# Patient Record
Sex: Male | Born: 1975
Health system: Southern US, Community
[De-identification: ages and names within clinical notes are randomized; demographics above are authoritative.]

---

## 1998-06-09 ENCOUNTER — Emergency Department (HOSPITAL_COMMUNITY): Admission: EM | Admit: 1998-06-09 | Discharge: 1998-06-09 | Payer: Self-pay | Admitting: Emergency Medicine

## 2000-09-05 ENCOUNTER — Emergency Department (HOSPITAL_COMMUNITY): Admission: EM | Admit: 2000-09-05 | Discharge: 2000-09-05 | Payer: Self-pay | Admitting: Emergency Medicine

## 2001-08-28 ENCOUNTER — Emergency Department (HOSPITAL_COMMUNITY): Admission: EM | Admit: 2001-08-28 | Discharge: 2001-08-28 | Payer: Self-pay | Admitting: Emergency Medicine

## 2002-03-09 ENCOUNTER — Emergency Department (HOSPITAL_COMMUNITY): Admission: EM | Admit: 2002-03-09 | Discharge: 2002-03-09 | Payer: Self-pay | Admitting: Emergency Medicine

## 2002-03-09 ENCOUNTER — Encounter: Payer: Self-pay | Admitting: *Deleted

## 2004-08-23 ENCOUNTER — Emergency Department (HOSPITAL_COMMUNITY): Admission: EM | Admit: 2004-08-23 | Discharge: 2004-08-23 | Payer: Self-pay | Admitting: Emergency Medicine

## 2006-02-12 ENCOUNTER — Emergency Department (HOSPITAL_COMMUNITY): Admission: EM | Admit: 2006-02-12 | Discharge: 2006-02-12 | Payer: Self-pay | Admitting: Emergency Medicine

## 2008-10-13 ENCOUNTER — Ambulatory Visit: Payer: Self-pay | Admitting: Internal Medicine

## 2008-11-28 ENCOUNTER — Telehealth: Payer: Self-pay | Admitting: Internal Medicine

## 2009-08-02 ENCOUNTER — Emergency Department (HOSPITAL_BASED_OUTPATIENT_CLINIC_OR_DEPARTMENT_OTHER): Admission: EM | Admit: 2009-08-02 | Discharge: 2009-08-02 | Payer: Self-pay | Admitting: Emergency Medicine

## 2010-10-15 ENCOUNTER — Ambulatory Visit: Payer: Self-pay | Admitting: Internal Medicine

## 2010-10-15 DIAGNOSIS — F17200 Nicotine dependence, unspecified, uncomplicated: Secondary | ICD-10-CM

## 2010-10-15 DIAGNOSIS — R5381 Other malaise: Secondary | ICD-10-CM | POA: Insufficient documentation

## 2010-10-15 DIAGNOSIS — R5383 Other fatigue: Secondary | ICD-10-CM

## 2010-10-15 LAB — CONVERTED CEMR LAB
ALT: 25 units/L (ref 0–53)
AST: 22 units/L (ref 0–37)
Albumin: 4.2 g/dL (ref 3.5–5.2)
Alkaline Phosphatase: 96 units/L (ref 39–117)
BUN: 10 mg/dL (ref 6–23)
Basophils Absolute: 0.1 10*3/uL (ref 0.0–0.1)
Basophils Relative: 1.2 % (ref 0.0–3.0)
Bilirubin, Direct: 0.1 mg/dL (ref 0.0–0.3)
CO2: 20 meq/L (ref 19–32)
Calcium: 9.5 mg/dL (ref 8.4–10.5)
Chloride: 105 meq/L (ref 96–112)
Cholesterol: 187 mg/dL (ref 0–200)
Creatinine, Ser: 0.9 mg/dL (ref 0.4–1.5)
Eosinophils Absolute: 0.2 10*3/uL (ref 0.0–0.7)
Eosinophils Relative: 3.1 % (ref 0.0–5.0)
GFR calc non Af Amer: 118.63 mL/min (ref >60)
Glucose, Bld: 93 mg/dL (ref 70–99)
HCT: 47.5 % (ref 39.0–52.0)
HDL: 46.8 mg/dL (ref >39.00)
Hemoglobin: 16.7 g/dL (ref 13.0–17.0)
LDL Cholesterol: 121 mg/dL — ABNORMAL HIGH (ref 0–99)
Lymphocytes Relative: 38.1 % (ref 12.0–46.0)
Lymphs Abs: 2.9 10*3/uL (ref 0.7–4.0)
MCHC: 35.1 g/dL (ref 30.0–36.0)
MCV: 91.9 fL (ref 78.0–100.0)
Monocytes Absolute: 0.5 10*3/uL (ref 0.1–1.0)
Monocytes Relative: 6.9 % (ref 3.0–12.0)
Neutro Abs: 3.8 10*3/uL (ref 1.4–7.7)
Neutrophils Relative %: 50.7 % (ref 43.0–77.0)
Platelets: 175 10*3/uL (ref 150.0–400.0)
Potassium: 4.6 meq/L (ref 3.5–5.1)
RBC: 5.17 M/uL (ref 4.22–5.81)
RDW: 13.8 % (ref 11.5–14.6)
Sodium: 140 meq/L (ref 135–145)
TSH: 1.42 microintl units/mL (ref 0.35–5.50)
Testosterone: 556.1 ng/dL (ref 350.00–890.00)
Total Bilirubin: 0.6 mg/dL (ref 0.3–1.2)
Total CHOL/HDL Ratio: 4
Total Protein: 7.3 g/dL (ref 6.0–8.3)
Triglycerides: 96 mg/dL (ref 0.0–149.0)
VLDL: 19.2 mg/dL (ref 0.0–40.0)
WBC: 7.6 10*3/uL (ref 4.5–10.5)

## 2010-10-16 ENCOUNTER — Encounter: Payer: Self-pay | Admitting: Internal Medicine

## 2010-10-22 ENCOUNTER — Telehealth: Payer: Self-pay | Admitting: Internal Medicine

## 2010-10-25 ENCOUNTER — Telehealth: Payer: Self-pay | Admitting: Internal Medicine

## 2011-01-24 NOTE — Progress Notes (Signed)
  Phone Note Call from Patient   Caller: Patient Summary of Call: Pt called to find out what his lab results were. Read letter.  Initial call taken by: Alysia Penna,  October 25, 2010 12:55 PM

## 2011-01-24 NOTE — Letter (Signed)
Summary: Lipid Letter  [facility]  [address] [place], [place] [address]   Phone: [phone]  Fax: [phone]    10/16/2010  Adam Morton [address] [place], [place]  [address]  Dear Adam Morton:  We have carefully reviewed your last lipid profile from 10/15/2010 and the results are noted below with a summary of recommendations for lipid management.    Cholesterol:       187     Goal: <200   HDL "good" Cholesterol:   96.29     Goal: >40   LDL "bad" Cholesterol:   121     Goal: <130   Triglycerides:       96.0     Goal: <150        TLC Diet (Therapeutic Lifestyle Change): Saturated Fats & Transfatty acids should be kept < 7% of total calories ***Reduce Saturated Fats Polyunstaurated Fat can be up to 10% of total calories Monounsaturated Fat Fat can be up to 20% of total calories Total Fat should be no greater than 25-35% of total calories Carbohydrates should be 50-60% of total calories Protein should be approximately 15% of total calories Fiber should be at least 20-30 grams a day ***Increased fiber may help lower LDL Total Cholesterol should be < 200mg /day Consider adding plant stanol/sterols to diet (example: Benacol spread) ***A higher intake of unsaturated fat may reduce Triglycerides and Increase HDL    Adjunctive Measures (may lower LIPIDS and reduce risk of Heart Attack) include: Aerobic Exercise (20-30 minutes 3-4 times a week) Limit Alcohol Consumption Weight Reduction Aspirin 75-81 mg a day by mouth (if not allergic or contraindicated) Dietary Fiber 20-30 grams a day by mouth     Current Medications: 1)    Chantix Starting Month Pak 0.5 Mg X 11 & 1 Mg X 42 Tabs (Varenicline tartrate) .... Take as directed  If you have any questions, please call. We appreciate being able to work with you.   Sincerely,    [facility] Etta Grandchild MD

## 2011-01-24 NOTE — Progress Notes (Signed)
  Phone Note Call from Patient   Call For: [phone] or [phone] Summary of Call: mailed results to pt today. Initial call taken by: Jarome Lamas,  October 22, 2010 11:02 AM

## 2011-01-24 NOTE — Letter (Signed)
Summary: Results Follow-up Letter  [facility]  [address] [place], [place] [address]   Phone: [phone]  Fax: [phone]    10/16/2010  [address] [place], [place]  [address]  Dear Adam Morton,   The following are the results of your recent test(s):  Test     Result     Testosterone   normal Liver/kidney   normal CBC       normal   _________________________________________________________  Please call for an appointment as directed _________________________________________________________ _________________________________________________________ _________________________________________________________  Sincerely,  Sanda Linger MD [facility]

## 2011-01-24 NOTE — Assessment & Plan Note (Signed)
Summary: PHYSICAL---STC   Vital Signs:  Patient profile:   35 year old male Height:      72 inches Weight:      162 pounds BMI:     22.05 O2 Sat:      98 % on Room air Temp:     98.3 degrees F oral Pulse rate:   72 / minute Pulse rhythm:   regular Resp:     16 per minute BP sitting:   114 / 80  (left arm)  O2 Flow:  Room air  Primary Care Provider:  Etta Grandchild MD   History of Present Illness: He returns for a complete physical but he also has complaints and requests.  1. He wants to try chantix to quit smoking 2. He has had fatigue for one year and wants to know if his testosterone level is low.  Preventive Screening-Counseling & Management  Alcohol-Tobacco     Alcohol drinks/day: 3     Alcohol type: vodka     >5/day in last 3 mos: no     Alcohol Counseling: not indicated; use of alcohol is not excessive or problematic     Feels need to cut down: no     Feels annoyed by complaints: no     Feels guilty re: drinking: no     Needs 'eye opener' in am: no     Smoking Status: current     Smoking Cessation Counseling: yes     Smoke Cessation Stage: ready     Packs/Day: 1.5     Year Started: 1993     Cans of tobacco/week: no     Tobacco Counseling: to quit use of tobacco products  Hep-HIV-STD-Contraception     Hepatitis Risk: no risk noted     HIV Risk: no risk noted     STD Risk: no risk noted     Dental Visit-last 6 months yes     Dental Care Counseling: to seek dental care; no dental care within six months     TSE monthly: yes     Testicular SE Education/Counseling to perform regular STE  Safety-Violence-Falls     Seat Belt Use: yes     Helmet Use: n/a     Firearms in the Home: no firearms in the home     Smoke Detectors: no     Violence in the Home: no risk noted      Sexual History:  currently monogamous.        Drug Use:  never.        Blood Transfusions:  no.    Medications Prior to Update: 1)  None  Current Medications (verified): 1)   Chantix Starting Month Pak 0.5 Mg X 11 & 1 Mg X 42 Tabs (Varenicline Tartrate) .... 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  Past Surgical History: Last updated: 10/13/2008 Denies surgical history  Family History: Last updated: 10/13/2008 Mom A and W 32 yoa Father A and W 31 yoa Brother A and W 25 yoa  Social History: Last updated: 10/13/2008 Occupation: Event Planner/promoter Single Current Smoker Alcohol use-yes Drug use-no Regular exercise-yes  Risk Factors: Alcohol Use: 3 (10/15/2010) >5 drinks/d w/in last 3 months: no (10/15/2010) Exercise: yes (10/13/2008)  Risk Factors: Smoking Status: current (10/15/2010) Packs/Day: 1.5 (10/15/2010) Cans of tobacco/wk: no (10/15/2010)  Family History: Reviewed history from 10/13/2008 and no changes required. Mom  A and W 58 yoa Father A and W 95 yoa Brother A and W 25 yoa  Social History: Reviewed history from 10/13/2008 and no changes required. Occupation: Event Planner/promoter Single Current Smoker Alcohol use-yes Drug use-no Regular exercise-yes Hepatitis Risk:  no risk noted HIV Risk:  no risk noted STD Risk:  no risk noted Dental Care w/in 6 mos.:  yes Seat Belt Use:  yes Sexual History:  currently monogamous Drug Use:  never Blood Transfusions:  no  Review of Systems  The patient denies anorexia, fever, weight loss, weight gain, chest pain, syncope, dyspnea on exertion, peripheral edema, prolonged cough, headaches, hemoptysis, abdominal pain, hematuria, suspicious skin lesions, difficulty walking, depression, enlarged lymph nodes, angioedema, and testicular masses.   General:  Complains of fatigue; denies chills, fever, loss of appetite, malaise, sleep disorder, sweats, weakness, and weight loss. GU:  Denies decreased libido, discharge, erectile dysfunction, hematuria, nocturia, urinary  frequency, and urinary hesitancy.  Physical Exam  General:  alert, well-developed, well-nourished, well-hydrated, appropriate dress, normal appearance, and healthy-appearing.   Eyes:  No corneal or conjunctival inflammation noted. EOMI. Perrla. Funduscopic exam benign, without hemorrhages, exudates or papilledema. Vision grossly normal. Mouth:  Oral mucosa and oropharynx without lesions or exudates.  Teeth in good repair. Neck:  supple, full ROM, no masses, no thyromegaly, no thyroid nodules or tenderness, and no JVD.   Breasts:  No masses or gynecomastia noted Lungs:  Normal respiratory effort, chest expands symmetrically. Lungs are clear to auscultation, no crackles or wheezes. Heart:  Normal rate and regular rhythm. S1 and S2 normal without gallop, murmur, click, rub or other extra sounds. Abdomen:  soft, non-tender, normal bowel sounds, no distention, no hepatomegaly, and no splenomegaly.   Genitalia:  circumcised, no hydrocele, no varicocele, no scrotal masses, no testicular masses or atrophy, no cutaneous lesions, and no urethral discharge.   Msk:  No deformity or scoliosis noted of thoracic or lumbar spine.   Pulses:  R and L carotid,radial,femoral,dorsalis pedis and posterior tibial pulses are full and equal bilaterally Extremities:  No clubbing, cyanosis, edema, or deformity noted with normal full range of motion of all joints.   Neurologic:  No cranial nerve deficits noted. Station and gait are normal. Plantar reflexes are down-going bilaterally. DTRs are symmetrical throughout. Sensory, motor and coordinative functions appear intact. Skin:  turgor normal, color normal, no rashes, no suspicious lesions, no ecchymoses, no petechiae, no purpura, no ulcerations, no edema, and tattoo(s).   Cervical Nodes:  no anterior cervical adenopathy and no posterior cervical adenopathy.   Axillary Nodes:  no R axillary adenopathy and no L axillary adenopathy.   Inguinal Nodes:  no R inguinal adenopathy  and no L inguinal adenopathy.   Psych:  Cognition and judgment appear intact. Alert and cooperative with normal attention span and concentration. No apparent delusions, illusions, hallucinations   Impression & Recommendations:  Problem # 1:  TOBACCO USE (ICD-305.1) Assessment Unchanged  His updated medication list for this problem includes:    Chantix Starting Month Pak 0.5 Mg X 11 & 1 Mg X 42 Tabs (Varenicline tartrate) .Marland Kitchen... Take as directed  Orders: Venipuncture (16109) TLB-Lipid Panel (80061-LIPID) TLB-BMP (Basic Metabolic Panel-BMET) (80048-METABOL) TLB-Hepatic/Liver Function Pnl (80076-HEPATIC) TLB-TSH (Thyroid Stimulating Hormone) (84443-TSH) TLB-CBC Platelet - w/Differential (85025-CBCD) TLB-Testosterone, Total (84403-TESTO) Tobacco use cessation intermediate 3-10 minutes ([address])  Problem # 2:  FATIGUE (ICD-780.79) Assessment: New  Orders: Venipuncture (09811) TLB-Lipid Panel (80061-LIPID) TLB-BMP (Basic Metabolic Panel-BMET) (80048-METABOL) TLB-Hepatic/Liver Function Pnl (80076-HEPATIC) TLB-TSH (Thyroid Stimulating Hormone) (84443-TSH) TLB-CBC  Platelet - w/Differential (85025-CBCD) TLB-Testosterone, Total (84403-TESTO) Tobacco use cessation intermediate 3-10 minutes (09323)  Problem # 3:  HEALTH MAINTENANCE EXAM (ICD-V70.0) Assessment: Unchanged  Orders: Venipuncture (55732) TLB-Lipid Panel (80061-LIPID) TLB-BMP (Basic Metabolic Panel-BMET) (80048-METABOL) TLB-Hepatic/Liver Function Pnl (80076-HEPATIC) TLB-TSH (Thyroid Stimulating Hormone) (84443-TSH) TLB-CBC Platelet - w/Differential (85025-CBCD) TLB-Testosterone, Total (84403-TESTO)  Discussed using sunscreen, use of alcohol, drug use, self testicular exam, routine dental care, routine eye care, routine physical exam, seat belts, multiple vitamins, osteoporosis prevention, adequate calcium intake in diet, and recommendations for immunizations.  Discussed exercise and checking cholesterol.  Discussed gun  safety, safe sex, and contraception. Also recommend checking PSA.  Complete Medication List: 1)  Chantix Starting Month Pak 0.5 Mg X 11 & 1 Mg X 42 Tabs (Varenicline tartrate) .... Take as directed  Other Orders: Tdap => 38yrs IM (20254) Admin 1st Vaccine (27062)  Patient Instructions: 1)  Please schedule a follow-up appointment in 1 month. 2)  Tobacco is very bad for your health and your loved ones! You Should stop smoking!. 3)  Stop Smoking Tips: Choose a Quit date. Cut down before the Quit date. decide what you will do as a substitute when you feel the urge to smoke(gum,toothpick,exercise). 4)  If you could be exposed to sexually transmitted diseases, you should use a condom. Prescriptions: CHANTIX STARTING MONTH PAK 0.5 MG X 11 & 1 MG X 42 TABS (VARENICLINE TARTRATE) take as directed  #1 month x 0   Entered and Authorized by:   Etta Grandchild MD   Signed by:   Etta Grandchild MD on 10/15/2010   Method used:   Electronically to        [facility] [address] (retail)       [phone]       [place], [place]  [address]       Ph: [phone]       Fax: [phone]   RxID:   [id number]    Orders Added: 1)  Venipuncture [16967] 2)  TLB-Lipid Panel [80061-LIPID] 3)  TLB-BMP (Basic Metabolic Panel-BMET) [80048-METABOL] 4)  TLB-Hepatic/Liver Function Pnl [80076-HEPATIC] 5)  TLB-TSH (Thyroid Stimulating Hormone) [84443-TSH] 6)  TLB-CBC Platelet - w/Differential [85025-CBCD] 7)  TLB-Testosterone, Total [84403-TESTO] 8)  Tdap => 63yrs IM [90715] 9)  Admin 1st Vaccine [90471] 10)  Est. Patient 18-39 years [99395] 11)  Tobacco use cessation intermediate 3-10 minutes [99406] 12)  Est. Patient Level IV [89381]   Immunizations Administered:  Tetanus Vaccine:    Vaccine Type: Tdap    Site: right deltoid    Mfr: GlaxoSmithKline    Dose: 0.5 ml    Route: IM    Given by: Rock Nephew CMA    Exp. Date: 10/11/2012    Lot #: [id number]     VIS given: 11/09/08 version given October 15, 2010.   Immunizations Administered:  Tetanus Vaccine:    Vaccine Type: Tdap    Site: right deltoid    Mfr: GlaxoSmithKline    Dose: 0.5 ml    Route: IM    Given by: Rock Nephew CMA    Exp. Date: 10/11/2012    Lot #: [id number]    VIS given: 11/09/08 version given October 15, 2010.

## 2011-07-26 ENCOUNTER — Emergency Department (HOSPITAL_BASED_OUTPATIENT_CLINIC_OR_DEPARTMENT_OTHER)
Admission: EM | Admit: 2011-07-26 | Discharge: 2011-07-26 | Disposition: A | Discharge status: Home / Self Care | Payer: Self-pay | Attending: Emergency Medicine | Admitting: Emergency Medicine

## 2011-07-26 ENCOUNTER — Encounter: Payer: Self-pay | Admitting: *Deleted

## 2011-07-26 DIAGNOSIS — R21 Rash and other nonspecific skin eruption: Secondary | ICD-10-CM | POA: Insufficient documentation

## 2011-07-26 MED ORDER — PREDNISONE 10 MG PO TABS: 20.0000 mg | ORAL_TABLET | Freq: Every day | ORAL | Status: AC

## 2011-07-26 NOTE — ED Provider Notes (Signed)
History     CSN: [id number] Arrival date & time: 07/26/2011  6:49 AM  Chief Complaint  Patient presents with  . Rash   Patient is a 35 y.o. male presenting with rash. The history is provided by the patient.  Rash  This is a new problem. The current episode started more than 1 week ago. The problem is associated with nothing. There has been no fever. The rash is present on the torso, right arm and left arm. The pain is at a severity of 0/10. The patient is experiencing no pain. The pain has been constant since onset. Associated symptoms include itching. He has tried anti-itch cream for the symptoms. The treatment provided mild relief.    History reviewed. No pertinent past medical history.  History reviewed. No pertinent past surgical history.  History reviewed. No pertinent family history.  History  Substance Use Topics  . Smoking status: Current Everyday Smoker  . Smokeless tobacco: Not on file  . Alcohol Use: 1.2 oz/week    2 Cans of beer per week      Review of Systems  Skin: Positive for itching and rash.  All other systems reviewed and are negative.    Physical Exam  BP 150/95  Pulse 85  Temp(Src) 97.9 F (36.6 C) (Oral)  Resp 18  SpO2 100%  Physical Exam  Constitutional: He is oriented to person, place, and time. He appears well-developed and well-nourished.  HENT:  Head: Normocephalic and atraumatic.  Eyes: Pupils are equal, round, and reactive to light.  Cardiovascular: Normal rate.   Pulmonary/Chest: Effort normal and breath sounds normal.  Abdominal: Soft.  Musculoskeletal: Normal range of motion.  Neurological: He is alert and oriented to person, place, and time. He has normal reflexes.  Skin: Rash noted. Rash is papular and urticarial.       ED Course  Procedures  MDM Patient advised po prednisone and f/u derm if continues- likely exposure to insect or other allergic dermatitis  Hilario Quarry, MD 07/26/11 [phone]

## 2011-07-26 NOTE — ED Notes (Signed)
Pt with rash to bilat arms x one month and began having rash onlower back x 2 weeks

## 2012-06-13 ENCOUNTER — Emergency Department (HOSPITAL_BASED_OUTPATIENT_CLINIC_OR_DEPARTMENT_OTHER): Payer: Self-pay

## 2012-06-13 ENCOUNTER — Inpatient Hospital Stay (HOSPITAL_BASED_OUTPATIENT_CLINIC_OR_DEPARTMENT_OTHER)
Admission: EM | Admit: 2012-06-13 | Discharge: 2012-06-17 | DRG: 494 | Disposition: A | Discharge status: Home / Self Care | Payer: Self-pay | Attending: Orthopedic Surgery | Admitting: Orthopedic Surgery

## 2012-06-13 ENCOUNTER — Encounter (HOSPITAL_BASED_OUTPATIENT_CLINIC_OR_DEPARTMENT_OTHER): Payer: Self-pay | Admitting: *Deleted

## 2012-06-13 DIAGNOSIS — Z7982 Long term (current) use of aspirin: Secondary | ICD-10-CM

## 2012-06-13 DIAGNOSIS — F1010 Alcohol abuse, uncomplicated: Secondary | ICD-10-CM | POA: Diagnosis present

## 2012-06-13 DIAGNOSIS — F17200 Nicotine dependence, unspecified, uncomplicated: Secondary | ICD-10-CM | POA: Diagnosis present

## 2012-06-13 DIAGNOSIS — Z7289 Other problems related to lifestyle: Secondary | ICD-10-CM

## 2012-06-13 DIAGNOSIS — Y9289 Other specified places as the place of occurrence of the external cause: Secondary | ICD-10-CM

## 2012-06-13 DIAGNOSIS — Z79899 Other long term (current) drug therapy: Secondary | ICD-10-CM

## 2012-06-13 DIAGNOSIS — F1210 Cannabis abuse, uncomplicated: Secondary | ICD-10-CM | POA: Diagnosis present

## 2012-06-13 DIAGNOSIS — S82143A Displaced bicondylar fracture of unspecified tibia, initial encounter for closed fracture: Secondary | ICD-10-CM

## 2012-06-13 DIAGNOSIS — I10 Essential (primary) hypertension: Secondary | ICD-10-CM | POA: Diagnosis present

## 2012-06-13 DIAGNOSIS — S82109A Unspecified fracture of upper end of unspecified tibia, initial encounter for closed fracture: Principal | ICD-10-CM | POA: Diagnosis present

## 2012-06-13 LAB — URINE MICROSCOPIC-ADD ON

## 2012-06-13 LAB — CBC
HCT: 44.5 % (ref 39.0–52.0)
Hemoglobin: 15.6 g/dL (ref 13.0–17.0)
MCHC: 35.3 g/dL (ref 30.0–36.0)
RBC: 4.93 MIL/uL (ref 4.22–5.81)
RDW: 13.2 % (ref 11.5–15.5)
RDW: 13.2 % (ref 11.5–15.5)
WBC: 10.4 10*3/uL (ref 4.0–10.5)
WBC: 13.1 10*3/uL — ABNORMAL HIGH (ref 4.0–10.5)

## 2012-06-13 LAB — DIFFERENTIAL
Basophils Absolute: 0.1 10*3/uL (ref 0.0–0.1)
Basophils Relative: 1 % (ref 0–1)
Lymphocytes Relative: 20 % (ref 12–46)
Neutro Abs: 7.3 10*3/uL (ref 1.7–7.7)
Neutrophils Relative %: 70 % (ref 43–77)

## 2012-06-13 LAB — URINALYSIS, ROUTINE W REFLEX MICROSCOPIC
Glucose, UA: NEGATIVE mg/dL
Leukocytes, UA: NEGATIVE
Protein, ur: NEGATIVE mg/dL
Urobilinogen, UA: 0.2 mg/dL (ref 0.0–1.0)

## 2012-06-13 LAB — BASIC METABOLIC PANEL
BUN: 8 mg/dL (ref 6–23)
CO2: 22 mEq/L (ref 19–32)
Chloride: 106 mEq/L (ref 96–112)
Chloride: 102 mEq/L (ref 96–112)
GFR calc Af Amer: >90 mL/min (ref >90)
GFR calc Af Amer: >90 mL/min (ref >90)
Glucose, Bld: 89 mg/dL (ref 70–99)
Potassium: 4.2 mEq/L (ref 3.5–5.1)
Potassium: 4.3 mEq/L (ref 3.5–5.1)
Sodium: 139 mEq/L (ref 135–145)

## 2012-06-13 MED ORDER — LACTATED RINGERS IV SOLN
INTRAVENOUS | Status: DC
  Administered 2012-06-15 (×2): via INTRAVENOUS

## 2012-06-13 MED ORDER — NICOTINE 21 MG/24HR TD PT24
21.0000 mg | MEDICATED_PATCH | Freq: Once | TRANSDERMAL | Status: DC
  Administered 2012-06-13: 21 mg via TRANSDERMAL

## 2012-06-13 MED ORDER — CHLORHEXIDINE GLUCONATE 4 % EX LIQD
60.0000 mL | Freq: Once | CUTANEOUS | Status: DC
  Filled 2012-06-13: qty 60

## 2012-06-13 MED ORDER — METHOCARBAMOL 500 MG PO TABS
500.0000 mg | ORAL_TABLET | Freq: Three times a day (TID) | ORAL | Status: DC | PRN
  Administered 2012-06-13 – 2012-06-14 (×2): 500 mg via ORAL
  Filled 2012-06-13 (×3): qty 1

## 2012-06-13 MED ORDER — ONDANSETRON HCL 4 MG/2ML IJ SOLN: 4.0000 mg | Freq: Three times a day (TID) | INTRAMUSCULAR | Status: AC | PRN

## 2012-06-13 MED ORDER — MORPHINE SULFATE 2 MG/ML IJ SOLN
2.0000 mg | INTRAMUSCULAR | Status: DC | PRN
  Administered 2012-06-15: 2 mg via INTRAVENOUS
  Filled 2012-06-13: qty 1

## 2012-06-13 MED ORDER — OXYCODONE-ACETAMINOPHEN 5-325 MG PO TABS
2.0000 | ORAL_TABLET | Freq: Once | ORAL | Status: AC
  Administered 2012-06-13: 2 via ORAL
  Filled 2012-06-13: qty 2

## 2012-06-13 MED ORDER — HYDROMORPHONE HCL PF 1 MG/ML IJ SOLN: 1.0000 mg | INTRAMUSCULAR | Status: DC | PRN

## 2012-06-13 MED ORDER — NICOTINE 21 MG/24HR TD PT24: 21.0000 mg | MEDICATED_PATCH | Freq: Once | TRANSDERMAL | Status: DC

## 2012-06-13 MED ORDER — OXYCODONE-ACETAMINOPHEN 5-325 MG PO TABS
1.0000 | ORAL_TABLET | ORAL | Status: DC | PRN
  Administered 2012-06-13 (×3): 2 via ORAL
  Administered 2012-06-14: 1 via ORAL
  Administered 2012-06-14: 2 via ORAL
  Filled 2012-06-13 (×5): qty 2

## 2012-06-13 MED ORDER — CEFAZOLIN SODIUM-DEXTROSE 2-3 GM-% IV SOLR
2.0000 g | INTRAVENOUS | Status: AC
  Administered 2012-06-15: 2 g via INTRAVENOUS
  Filled 2012-06-13 (×2): qty 50

## 2012-06-13 MED ORDER — NICOTINE 21 MG/24HR TD PT24
MEDICATED_PATCH | TRANSDERMAL | Status: AC
  Administered 2012-06-13: 21 mg via TRANSDERMAL
  Filled 2012-06-13: qty 1
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
## 2012-06-14 ENCOUNTER — Inpatient Hospital Stay (HOSPITAL_COMMUNITY): Payer: Self-pay

## 2012-06-14 ENCOUNTER — Encounter (HOSPITAL_COMMUNITY): Payer: Self-pay | Admitting: Anesthesiology

## 2012-06-14 LAB — TYPE AND SCREEN

## 2012-06-14 MED ORDER — METHOCARBAMOL 500 MG PO TABS
500.0000 mg | ORAL_TABLET | Freq: Three times a day (TID) | ORAL | Status: DC | PRN
  Administered 2012-06-14 – 2012-06-15 (×2): 1000 mg via ORAL
  Administered 2012-06-16 – 2012-06-17 (×2): 500 mg via ORAL
  Filled 2012-06-14 (×2): qty 2
  Filled 2012-06-14 (×2): qty 1

## 2012-06-14 MED ORDER — ACETAMINOPHEN 10 MG/ML IV SOLN
1000.0000 mg | Freq: Four times a day (QID) | INTRAVENOUS | Status: AC
  Administered 2012-06-14 – 2012-06-15 (×4): 1000 mg via INTRAVENOUS
  Filled 2012-06-14 (×4): qty 100

## 2012-06-14 MED ORDER — OXYCODONE HCL 5 MG PO TABS
5.0000 mg | ORAL_TABLET | ORAL | Status: DC | PRN
  Administered 2012-06-14 – 2012-06-15 (×2): 10 mg via ORAL
  Filled 2012-06-14 (×2): qty 2

## 2012-06-14 MED ORDER — PREGABALIN 50 MG PO CAPS
75.0000 mg | ORAL_CAPSULE | Freq: Two times a day (BID) | ORAL | Status: DC
  Administered 2012-06-14 – 2012-06-17 (×6): 75 mg via ORAL
  Filled 2012-06-14 (×2): qty 1
  Filled 2012-06-14: qty 2
  Filled 2012-06-14: qty 1
  Filled 2012-06-14 (×3): qty 3
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
## 2012-06-15 ENCOUNTER — Encounter (HOSPITAL_COMMUNITY): Payer: Self-pay | Admitting: Anesthesiology

## 2012-06-15 ENCOUNTER — Encounter (HOSPITAL_COMMUNITY)
Admission: EM | Disposition: A | Discharge status: Home / Self Care | Payer: Self-pay | Source: Home / Self Care | Attending: Orthopedic Surgery

## 2012-06-15 ENCOUNTER — Inpatient Hospital Stay (HOSPITAL_COMMUNITY): Payer: Self-pay

## 2012-06-15 ENCOUNTER — Inpatient Hospital Stay (HOSPITAL_COMMUNITY): Payer: Self-pay | Admitting: Anesthesiology

## 2012-06-15 HISTORY — PX: ORIF TIBIA PLATEAU: SHX2132

## 2012-06-15 SURGERY — OPEN REDUCTION INTERNAL FIXATION (ORIF) TIBIAL PLATEAU
Anesthesia: General | Site: Leg Lower | Laterality: Right | Wound class: Clean

## 2012-06-15 MED ORDER — LABETALOL HCL 5 MG/ML IV SOLN
INTRAVENOUS | Status: DC | PRN
  Administered 2012-06-15 (×4): 5 mg via INTRAVENOUS

## 2012-06-15 MED ORDER — LIDOCAINE HCL (CARDIAC) 20 MG/ML IV SOLN
INTRAVENOUS | Status: DC | PRN
  Administered 2012-06-15: 100 mg via INTRAVENOUS

## 2012-06-15 MED ORDER — LIDOCAINE HCL 4 % MT SOLN
OROMUCOSAL | Status: DC | PRN
  Administered 2012-06-15: 4 mL via TOPICAL

## 2012-06-15 MED ORDER — HYDROMORPHONE HCL PF 1 MG/ML IJ SOLN
INTRAMUSCULAR | Status: AC
  Filled 2012-06-15: qty 1

## 2012-06-15 MED ORDER — HYDRALAZINE HCL 20 MG/ML IJ SOLN
10.0000 mg | Freq: Once | INTRAMUSCULAR | Status: AC
  Administered 2012-06-15: 10 mg via INTRAVENOUS
  Filled 2012-06-15 (×2): qty 0.5

## 2012-06-15 MED ORDER — ONDANSETRON HCL 4 MG/2ML IJ SOLN
INTRAMUSCULAR | Status: DC | PRN
  Administered 2012-06-15: 4 mg via INTRAVENOUS

## 2012-06-15 MED ORDER — ONDANSETRON HCL 4 MG/2ML IJ SOLN: 4.0000 mg | Freq: Four times a day (QID) | INTRAMUSCULAR | Status: DC | PRN

## 2012-06-15 MED ORDER — DOCUSATE SODIUM 100 MG PO CAPS
100.0000 mg | ORAL_CAPSULE | Freq: Two times a day (BID) | ORAL | Status: DC
  Administered 2012-06-16 – 2012-06-17 (×3): 100 mg via ORAL
  Filled 2012-06-15 (×5): qty 1

## 2012-06-15 MED ORDER — ACETAMINOPHEN 10 MG/ML IV SOLN
INTRAVENOUS | Status: AC
  Filled 2012-06-15: qty 100

## 2012-06-15 MED ORDER — ONDANSETRON HCL 4 MG PO TABS: 4.0000 mg | ORAL_TABLET | Freq: Four times a day (QID) | ORAL | Status: DC | PRN

## 2012-06-15 MED ORDER — HYDRALAZINE HCL 20 MG/ML IJ SOLN
INTRAMUSCULAR | Status: DC | PRN
  Administered 2012-06-15: 5 mg via INTRAVENOUS

## 2012-06-15 MED ORDER — MORPHINE SULFATE 10 MG/ML IJ SOLN
INTRAMUSCULAR | Status: DC | PRN
  Administered 2012-06-15: 3 mg via INTRAVENOUS
  Administered 2012-06-15: 4 mg via INTRAVENOUS
  Administered 2012-06-15: 3 mg via INTRAVENOUS

## 2012-06-15 MED ORDER — METOCLOPRAMIDE HCL 10 MG PO TABS: 5.0000 mg | ORAL_TABLET | Freq: Three times a day (TID) | ORAL | Status: DC | PRN

## 2012-06-15 MED ORDER — POLYETHYLENE GLYCOL 3350 17 G PO PACK
17.0000 g | PACK | Freq: Every day | ORAL | Status: DC
  Filled 2012-06-15 (×3): qty 1

## 2012-06-15 MED ORDER — SUFENTANIL CITRATE 50 MCG/ML IV SOLN
INTRAVENOUS | Status: DC | PRN
  Administered 2012-06-15: 10 ug via INTRAVENOUS
  Administered 2012-06-15: 20 ug via INTRAVENOUS
  Administered 2012-06-15 (×2): 10 ug via INTRAVENOUS

## 2012-06-15 MED ORDER — NALOXONE HCL 0.4 MG/ML IJ SOLN: 0.4000 mg | INTRAMUSCULAR | Status: DC | PRN

## 2012-06-15 MED ORDER — TEMAZEPAM 15 MG PO CAPS: 15.0000 mg | ORAL_CAPSULE | Freq: Every evening | ORAL | Status: DC | PRN

## 2012-06-15 MED ORDER — HYDROMORPHONE HCL PF 1 MG/ML IJ SOLN
0.2500 mg | INTRAMUSCULAR | Status: DC | PRN
  Administered 2012-06-15 (×2): 0.5 mg via INTRAVENOUS

## 2012-06-15 MED ORDER — ACETAMINOPHEN 10 MG/ML IV SOLN
1000.0000 mg | Freq: Four times a day (QID) | INTRAVENOUS | Status: DC
  Administered 2012-06-16: 1000 mg via INTRAVENOUS
  Filled 2012-06-15 (×4): qty 100

## 2012-06-15 MED ORDER — DEXAMETHASONE SODIUM PHOSPHATE 4 MG/ML IJ SOLN
INTRAMUSCULAR | Status: DC | PRN
  Administered 2012-06-15: 4 mg via INTRAVENOUS

## 2012-06-15 MED ORDER — POTASSIUM CHLORIDE IN NACL 20-0.9 MEQ/L-% IV SOLN
INTRAVENOUS | Status: DC
  Administered 2012-06-16: 06:00:00 via INTRAVENOUS
  Filled 2012-06-15 (×5): qty 1000

## 2012-06-15 MED ORDER — ROCURONIUM BROMIDE 100 MG/10ML IV SOLN
INTRAVENOUS | Status: DC | PRN
  Administered 2012-06-15: 50 mg via INTRAVENOUS

## 2012-06-15 MED ORDER — CEFAZOLIN SODIUM 1-5 GM-% IV SOLN
1.0000 g | Freq: Three times a day (TID) | INTRAVENOUS | Status: AC
  Administered 2012-06-15 – 2012-06-16 (×3): 1 g via INTRAVENOUS
  Filled 2012-06-15 (×3): qty 50

## 2012-06-15 MED ORDER — SODIUM CHLORIDE 0.9 % IJ SOLN: 9.0000 mL | INTRAMUSCULAR | Status: DC | PRN

## 2012-06-15 MED ORDER — DIPHENHYDRAMINE HCL 50 MG/ML IJ SOLN: 12.5000 mg | Freq: Four times a day (QID) | INTRAMUSCULAR | Status: DC | PRN

## 2012-06-15 MED ORDER — MIDAZOLAM HCL 5 MG/5ML IJ SOLN
INTRAMUSCULAR | Status: DC | PRN
  Administered 2012-06-15: 2 mg via INTRAVENOUS

## 2012-06-15 MED ORDER — DIPHENHYDRAMINE HCL 12.5 MG/5ML PO ELIX: 12.5000 mg | ORAL_SOLUTION | Freq: Four times a day (QID) | ORAL | Status: DC | PRN

## 2012-06-15 MED ORDER — HYDRALAZINE HCL 20 MG/ML IJ SOLN
10.0000 mg | INTRAMUSCULAR | Status: DC | PRN
  Filled 2012-06-15: qty 0.5

## 2012-06-15 MED ORDER — DROPERIDOL 2.5 MG/ML IJ SOLN
INTRAMUSCULAR | Status: DC | PRN
  Administered 2012-06-15: 0.625 mg via INTRAVENOUS

## 2012-06-15 MED ORDER — HYDROMORPHONE 0.3 MG/ML IV SOLN
INTRAVENOUS | Status: DC
  Administered 2012-06-15: 17:00:00 via INTRAVENOUS
  Administered 2012-06-15: 0.9 mg via INTRAVENOUS
  Administered 2012-06-16: 1.2 mg via INTRAVENOUS

## 2012-06-15 MED ORDER — NEOSTIGMINE METHYLSULFATE 1 MG/ML IJ SOLN
INTRAMUSCULAR | Status: DC | PRN
  Administered 2012-06-15: 3 mg via INTRAVENOUS

## 2012-06-15 MED ORDER — HYDROMORPHONE 0.3 MG/ML IV SOLN
INTRAVENOUS | Status: AC
  Filled 2012-06-15: qty 25

## 2012-06-15 MED ORDER — LACTATED RINGERS IV SOLN
INTRAVENOUS | Status: DC | PRN
  Administered 2012-06-15 (×2): via INTRAVENOUS

## 2012-06-15 MED ORDER — METOCLOPRAMIDE HCL 5 MG/ML IJ SOLN: 5.0000 mg | Freq: Three times a day (TID) | INTRAMUSCULAR | Status: DC | PRN

## 2012-06-15 MED ORDER — ONDANSETRON HCL 4 MG/2ML IJ SOLN: 4.0000 mg | Freq: Once | INTRAMUSCULAR | Status: AC | PRN

## 2012-06-15 MED ORDER — PROPOFOL 10 MG/ML IV EMUL
INTRAVENOUS | Status: DC | PRN
  Administered 2012-06-15: 200 mg via INTRAVENOUS

## 2012-06-15 MED ORDER — BISACODYL 10 MG RE SUPP: 10.0000 mg | Freq: Every day | RECTAL | Status: DC | PRN

## 2012-06-15 MED ORDER — GLYCOPYRROLATE 0.2 MG/ML IJ SOLN
INTRAMUSCULAR | Status: DC | PRN
  Administered 2012-06-15: .4 mg via INTRAVENOUS

## 2012-06-15 MED ORDER — METOPROLOL TARTRATE 1 MG/ML IV SOLN
INTRAVENOUS | Status: DC | PRN
  Administered 2012-06-15: 1 mg via INTRAVENOUS
  Administered 2012-06-15 (×2): 2 mg via INTRAVENOUS

## 2012-06-15 MED ORDER — 0.9 % SODIUM CHLORIDE (POUR BTL) OPTIME
TOPICAL | Status: DC | PRN
  Administered 2012-06-15: 1000 mL

## 2012-06-15 MED ORDER — ENOXAPARIN SODIUM 40 MG/0.4ML ~~LOC~~ SOLN
40.0000 mg | SUBCUTANEOUS | Status: DC
  Administered 2012-06-16 – 2012-06-17 (×2): 40 mg via SUBCUTANEOUS
  Filled 2012-06-15 (×4): qty 0.4

## 2012-06-15 SURGICAL SUPPLY — 87 items
BANDAGE ELASTIC 4 VELCRO ST LF (GAUZE/BANDAGES/DRESSINGS) IMPLANT
BANDAGE ELASTIC 6 VELCRO ST LF (GAUZE/BANDAGES/DRESSINGS) IMPLANT
BANDAGE GAUZE ELAST BULKY 4 IN (GAUZE/BANDAGES/DRESSINGS) IMPLANT
BIT DRILL 100X2.5XANTM LCK (BIT) ×1 IMPLANT
BIT DRILL CAL (BIT) ×1 IMPLANT
BIT DRL 100X2.5XANTM LCK (BIT) ×1
BLADE SURG 10 STRL SS (BLADE) ×2 IMPLANT
BLADE SURG 15 STRL LF DISP TIS (BLADE) ×1 IMPLANT
BLADE SURG 15 STRL SS (BLADE) ×1
BLADE SURG ROTATE 9660 (MISCELLANEOUS) ×2 IMPLANT
BRUSH SCRUB DISP (MISCELLANEOUS) ×4 IMPLANT
CEMENT CAL PHOSPHATE 5CC (Cement) ×2 IMPLANT
CLEANER TIP ELECTROSURG 2X2 (MISCELLANEOUS) ×2 IMPLANT
CLOTH BEACON ORANGE TIMEOUT ST (SAFETY) ×2 IMPLANT
COVER MAYO STAND STRL (DRAPES) ×2 IMPLANT
DRAPE C-ARM 42X72 X-RAY (DRAPES) ×2 IMPLANT
DRAPE C-ARMOR (DRAPES) ×2 IMPLANT
DRAPE INCISE IOBAN 66X45 STRL (DRAPES) ×2 IMPLANT
DRAPE ORTHO SPLIT 77X108 STRL (DRAPES) ×1
DRAPE STERILE CONDUCTIVE (DRAPES) ×2 IMPLANT
DRAPE SURG ORHT 6 SPLT 77X108 (DRAPES) ×1 IMPLANT
DRAPE U-SHAPE 47X51 STRL (DRAPES) ×2 IMPLANT
DRILL BIT 2.5MM (BIT) ×1
DRILL BIT CAL (BIT) ×2
DRSG ADAPTIC 3X8 NADH LF (GAUZE/BANDAGES/DRESSINGS) IMPLANT
DRSG PAD ABDOMINAL 8X10 ST (GAUZE/BANDAGES/DRESSINGS) IMPLANT
ELECT REM PT RETURN 9FT ADLT (ELECTROSURGICAL) ×2
ELECTRODE REM PT RTRN 9FT ADLT (ELECTROSURGICAL) ×1 IMPLANT
EVACUATOR 1/8 PVC DRAIN (DRAIN) IMPLANT
EVACUATOR 3/16  PVC DRAIN (DRAIN)
EVACUATOR 3/16 PVC DRAIN (DRAIN) IMPLANT
GLOVE BIO SURGEON STRL SZ 6.5 (GLOVE) ×2 IMPLANT
GLOVE BIO SURGEON STRL SZ7.5 (GLOVE) ×2 IMPLANT
GLOVE BIO SURGEON STRL SZ8 (GLOVE) ×2 IMPLANT
GLOVE BIOGEL PI IND STRL 7.5 (GLOVE) ×1 IMPLANT
GLOVE BIOGEL PI IND STRL 8 (GLOVE) ×1 IMPLANT
GLOVE BIOGEL PI INDICATOR 7.5 (GLOVE) ×1
GLOVE BIOGEL PI INDICATOR 8 (GLOVE) ×1
GLOVE BIOGEL PI ORTHO PRO SZ7 (GLOVE) ×1
GLOVE PI ORTHO PRO STRL SZ7 (GLOVE) ×1 IMPLANT
GOWN PREVENTION PLUS XLARGE (GOWN DISPOSABLE) ×4 IMPLANT
GOWN STRL NON-REIN LRG LVL3 (GOWN DISPOSABLE) ×2 IMPLANT
IMMOBILIZER KNEE 22 UNIV (SOFTGOODS) IMPLANT
K-WIRE ACE 1.6X6 (WIRE) ×6
KIT BASIN OR (CUSTOM PROCEDURE TRAY) ×2 IMPLANT
KIT ROOM TURNOVER OR (KITS) ×2 IMPLANT
KWIRE ACE 1.6X6 (WIRE) ×3 IMPLANT
MANIFOLD NEPTUNE II (INSTRUMENTS) ×2 IMPLANT
NEEDLE 22X1 1/2 (OR ONLY) (NEEDLE) IMPLANT
NS IRRIG 1000ML POUR BTL (IV SOLUTION) ×2 IMPLANT
PACK ORTHO EXTREMITY (CUSTOM PROCEDURE TRAY) ×2 IMPLANT
PAD ARMBOARD 7.5X6 YLW CONV (MISCELLANEOUS) ×4 IMPLANT
PAD CAST 3X4 CTTN HI CHSV (CAST SUPPLIES) ×1 IMPLANT
PAD CAST 4YDX4 CTTN HI CHSV (CAST SUPPLIES) IMPLANT
PADDING CAST COTTON 3X4 STRL (CAST SUPPLIES) ×1
PADDING CAST COTTON 4X4 STRL (CAST SUPPLIES)
PADDING CAST COTTON 6X4 STRL (CAST SUPPLIES) IMPLANT
PLATE LOCK 3H STD RT PROX TIB (Plate) ×2 IMPLANT
PLEXUR M 10CC (Putty) ×2 IMPLANT
SCREW CORTICAL 3.5MM  44MM (Screw) ×1 IMPLANT
SCREW CORTICAL 3.5MM 44MM (Screw) ×1 IMPLANT
SCREW CORTICAL 3.5MM 48MM (Screw) ×2 IMPLANT
SCREW LOCK CORT STAR 3.5X60 (Screw) ×2 IMPLANT
SCREW LP 3.5X75MM (Screw) ×2 IMPLANT
SCREW LP 3.5X85MM (Screw) ×4 IMPLANT
SPONGE GAUZE 4X4 12PLY (GAUZE/BANDAGES/DRESSINGS) IMPLANT
SPONGE LAP 18X18 X RAY DECT (DISPOSABLE) ×2 IMPLANT
STAPLER VISISTAT 35W (STAPLE) ×2 IMPLANT
STOCKINETTE IMPERVIOUS LG (DRAPES) ×2 IMPLANT
SUCTION FRAZIER TIP 10 FR DISP (SUCTIONS) ×2 IMPLANT
SUT ETHILON 3 0 PS 1 (SUTURE) IMPLANT
SUT PROLENE 0 CT 1 30 (SUTURE) ×2 IMPLANT
SUT PROLENE 0 CT 2 (SUTURE) IMPLANT
SUT PROLENE 0 SH 30 (SUTURE) ×2 IMPLANT
SUT VIC AB 0 CT1 27 (SUTURE) ×1
SUT VIC AB 0 CT1 27XBRD ANBCTR (SUTURE) ×1 IMPLANT
SUT VIC AB 1 CT1 27 (SUTURE) ×1
SUT VIC AB 1 CT1 27XBRD ANBCTR (SUTURE) ×1 IMPLANT
SUT VIC AB 2-0 CT1 27 (SUTURE) ×2
SUT VIC AB 2-0 CT1 TAPERPNT 27 (SUTURE) ×2 IMPLANT
SYR 20ML ECCENTRIC (SYRINGE) IMPLANT
TOWEL OR 17X24 6PK STRL BLUE (TOWEL DISPOSABLE) ×2 IMPLANT
TOWEL OR 17X26 10 PK STRL BLUE (TOWEL DISPOSABLE) ×4 IMPLANT
TRAY FOLEY CATH 14FR (SET/KITS/TRAYS/PACK) IMPLANT
TUBE CONNECTING 12X1/4 (SUCTIONS) ×2 IMPLANT
WATER STERILE IRR 1000ML POUR (IV SOLUTION) IMPLANT
YANKAUER SUCT BULB TIP NO VENT (SUCTIONS) ×2 IMPLANT

## 2012-06-15 NOTE — Progress Notes (Signed)
Patient stable AF VSS NVI Immobilizer in place Compartments soft/NT  Plan on ORIF today with Dr Carola Frost No new recommendations Will transfer care to Dr Carola Frost post-op

## 2012-06-15 NOTE — Preoperative (Signed)
Beta Blockers   Reason not to administer Beta Blockers:Not Applicable 

## 2012-06-15 NOTE — Anesthesia Postprocedure Evaluation (Signed)
  Anesthesia Post-op Note  Patient: Adam Morton  Procedure(s) Performed: Procedure(s) (LRB): OPEN REDUCTION INTERNAL FIXATION (ORIF) TIBIAL PLATEAU (Right)  Patient Location: PACU  Anesthesia Type: General  Level of Consciousness: awake, alert  and oriented  Airway and Oxygen Therapy: Patient Spontanous Breathing and Patient connected to nasal cannula oxygen  Post-op Pain: moderate  Post-op Assessment: Post-op Vital signs reviewed  Post-op Vital Signs: Reviewed  Complications: No apparent anesthesia complications

## 2012-06-15 NOTE — Brief Op Note (Signed)
06/13/2012 - 06/15/2012  6:46 PM  PATIENT:  Adam Morton  36 y.o. male  PRE-OPERATIVE DIAGNOSIS:  right tibia unicondylar plateau fracture, eminence fracture  POST-OPERATIVE DIAGNOSIS:  right tibia unicondylar plateau fracture, eminence fracture  PROCEDURE:  Procedure(s) (LRB): 1. OPEN REDUCTION INTERNAL FIXATION (ORIF) unicondylar TIBIAL PLATEAU (Right) 2. ORIF [place] fracture 3. Anterior compartment fasciotomy  SURGEON:  Surgeon(s) and Role:    * Budd Palmer, MD - Primary  PHYSICIAN ASSISTANT: Montez Morita, [facility]  ANESTHESIA:   general  EBL:  Total I/O In: 1000 [I.V.:1000] Out: -   BLOOD ADMINISTERED:none  DRAINS: none   LOCAL MEDICATIONS USED:  NONE  SPECIMEN:  No Specimen  DISPOSITION OF SPECIMEN:  N/A  COUNTS:  YES  TOURNIQUET:  None  DICTATION: .Other Dictation: Dictation Number [id number]  PLAN OF CARE: Admit to inpatient   PATIENT DISPOSITION:  PACU - hemodynamically stable.   Delay start of Pharmacological VTE agent (>24hrs) due to surgical blood loss or risk of bleeding: no

## 2012-06-15 NOTE — Anesthesia Preprocedure Evaluation (Addendum)
Anesthesia Evaluation  Patient identified by MRN, date of birth, ID band Patient awake    Reviewed: Allergy & Precautions, H&P , NPO status , Patient's Chart, lab work & pertinent test results  History of Anesthesia Complications Negative for: history of anesthetic complications  Airway Mallampati: II TM Distance: >3 FB Neck ROM: Full    Dental  (+) Dental Advisory Given and Teeth Intact   Pulmonary Current Smoker,  breath sounds clear to auscultation        Cardiovascular negative cardio ROS  Rhythm:Regular Rate:Normal     Neuro/Psych negative neurological ROS     GI/Hepatic negative GI ROS, Neg liver ROS,   Endo/Other  negative endocrine ROS  Renal/GU negative Renal ROS  negative genitourinary   Musculoskeletal negative musculoskeletal ROS (+)   Abdominal   Peds  Hematology negative hematology ROS (+)   Anesthesia Other Findings   Reproductive/Obstetrics                          Anesthesia Physical Anesthesia Plan  ASA: II  Anesthesia Plan: General   Post-op Pain Management:    Induction:   Airway Management Planned: LMA  Additional Equipment:   Intra-op Plan:   Post-operative Plan: Extubation in OR  Informed Consent: I have reviewed the patients History and Physical, chart, labs and discussed the procedure including the risks, benefits and alternatives for the proposed anesthesia with the patient or authorized representative who has indicated his/her understanding and acceptance.   Dental advisory given  Plan Discussed with: CRNA, Anesthesiologist and Surgeon  Anesthesia Plan Comments:        Anesthesia Quick Evaluation

## 2012-06-15 NOTE — Consult Note (Signed)
I have seen and examined the patient. I agree with the findings above. I discussed with the patient the risks and benefits of surgery, including the possibility of infection, nerve injury, vessel injury, wound breakdown, arthritis, symptomatic hardware, DVT/ PE, loss of motion, malunion, nonunion, and need for further surgery among others.  He understood these risks and wished to proceed.  Patient seen at 12:57pm  Myrene Galas, MD [facility] [phone] (p)

## 2012-06-15 NOTE — Transfer of Care (Signed)
Immediate Anesthesia Transfer of Care Note  Patient: Adam Morton  Procedure(s) Performed: Procedure(s) (LRB): OPEN REDUCTION INTERNAL FIXATION (ORIF) TIBIAL PLATEAU (Right)  Patient Location: PACU  Anesthesia Type: General  Level of Consciousness: awake, alert  and oriented  Airway & Oxygen Therapy: Patient Spontanous Breathing and Patient connected to nasal cannula oxygen  Post-op Assessment: Report given to PACU RN, Post -op Vital signs reviewed and stable and Patient moving all extremities  Post vital signs: Reviewed and stable  Complications: No apparent anesthesia complications

## 2012-06-16 DIAGNOSIS — S82143A Displaced bicondylar fracture of unspecified tibia, initial encounter for closed fracture: Secondary | ICD-10-CM

## 2012-06-16 DIAGNOSIS — Z7289 Other problems related to lifestyle: Secondary | ICD-10-CM

## 2012-06-16 DIAGNOSIS — I10 Essential (primary) hypertension: Secondary | ICD-10-CM

## 2012-06-16 LAB — BASIC METABOLIC PANEL
CO2: 24 mEq/L (ref 19–32)
Calcium: 9.4 mg/dL (ref 8.4–10.5)
Creatinine, Ser: 0.81 mg/dL (ref 0.50–1.35)
Glucose, Bld: 113 mg/dL — ABNORMAL HIGH (ref 70–99)

## 2012-06-16 LAB — CBC
MCH: 31.1 pg (ref 26.0–34.0)
MCV: 91.3 fL (ref 78.0–100.0)
Platelets: 203 10*3/uL (ref 150–400)
RDW: 13 % (ref 11.5–15.5)

## 2012-06-16 MED ORDER — OXYCODONE-ACETAMINOPHEN 5-325 MG PO TABS
1.0000 | ORAL_TABLET | Freq: Four times a day (QID) | ORAL | Status: DC | PRN
  Administered 2012-06-16 – 2012-06-17 (×3): 2 via ORAL
  Filled 2012-06-16 (×3): qty 2

## 2012-06-16 MED ORDER — OXYCODONE HCL 5 MG PO TABS
5.0000 mg | ORAL_TABLET | ORAL | Status: DC | PRN
  Administered 2012-06-16 – 2012-06-17 (×3): 10 mg via ORAL
  Filled 2012-06-16 (×3): qty 2

## 2012-06-16 NOTE — Evaluation (Signed)
Occupational Therapy Evaluation Patient Details Name: Adam Morton MRN: [id number] DOB: 16-Oct-1976 Today's Date: 06/16/2012 Time: 1140-1210    OT Assessment / Plan / Recommendation Clinical Impression  Pt presents to OT at S level with ADL activity. Pt did well with NWB and ADL activity. Pt aware he can not get in a bath tub at this time and will perform a sponge bath    OT Assessment  Patient does not need any further OT services    Follow Up Recommendations  No OT follow up       Equipment Recommendations  None recommended by OT          Precautions / Restrictions Precautions Required Braces or Orthoses:  (hinged knee brace on R LE, unrestricted ROM in brace) Restrictions RLE Weight Bearing: Non weight bearing       ADL  Grooming: Performed;Wash/dry face;Teeth care;Wash/dry hands Where Assessed - Grooming: Supported standing;Other (comment) (with walker) Upper Body Bathing: Performed Where Assessed - Upper Body Bathing: Unsupported sitting Lower Body Bathing: Performed Where Assessed - Lower Body Bathing: Supported sit to stand;Other (comment) Upper Body Dressing: Performed;Supervision/safety Where Assessed - Upper Body Dressing: Unsupported sitting Lower Body Dressing: Performed;Supervision/safety Where Assessed - Lower Body Dressing: Supported standing;Other (comment) (walker) Toilet Transfer: Research scientist (life sciences) Method: Sit to Barista: Regular height toilet Toileting - Clothing Manipulation and Hygiene: Performed;Supervision/safety Where Assessed - Toileting Clothing Manipulation and Hygiene: Standing Transfers/Ambulation Related to ADLs: Pt overall S with ADL activity.   Pt states friends and family will assist him         Visit Information  Last OT Received On: 06/16/12 Assistance Needed: +1       Prior Functioning  Home Living Lives With: Alone Available Help at Discharge: Friend(s);Available  PRN/intermittently (plans on going to girlfriends apt at night) Type of Home: Apartment Home Access: Stairs to enter [employer] of Steps: 25 (25 steps to get into girlfriends, 0 steps to get into his) Entrance Stairs-Rails: Can reach both Home Layout: One level (pt apt 2 story with full bath up stairs, 20 setps with L HR) Bathroom Shower/Tub: Engineer, manufacturing systems: Standard How Accessible:  (only via crutches) Home Adaptive Equipment: None Prior Function Level of Independence: Independent Able to Take Stairs?: Yes Driving: Yes Vocation: Full time employment Communication Communication: No difficulties Dominant Hand: Right    Cognition  Overall Cognitive Status: Appears within functional limits for tasks assessed/performed Arousal/Alertness: Awake/alert Orientation Level: Appears intact for tasks assessed Behavior During Session: [facility] for tasks performed                End of Session OT - End of Session Activity Tolerance: Patient tolerated treatment well Patient left: in chair   Diahann Guajardo D 06/16/2012, 12:20 PM

## 2012-06-16 NOTE — Op Note (Signed)
NAMEABDALRAHMAN, Adam Morton NO.:  [id number]  MEDICAL RECORD NO.:  [id number]  LOCATION:  5N24C                        FACILITY:  [facility]  PHYSICIAN:  Doralee Albino. Carola Frost, M.D. DATE OF BIRTH:  October 22, 1976  DATE OF PROCEDURE:  06/15/2012 DATE OF DISCHARGE:                              OPERATIVE REPORT   PREOPERATIVE DIAGNOSES: 1. Right unicondylar lateral tibial plateau fracture. 2. Eminence fracture.  POSTOPERATIVE DIAGNOSES: 1. Right unicondylar lateral tibial plateau fracture. 2. Eminence fracture.  PROCEDURES: 1. Open reduction and internal fixation of lateral unicondylar tibial     plateau fracture. 2. Open treatment of tibial eminence fracture. 3. Anterior compartment fasciotomy.  SURGEON:  Doralee Albino. Carola Frost, MD  ASSISTANT:  Mearl Latin, PA-C  ANESTHESIA:  General.  COMPLICATIONS:  None.  TOURNIQUET:  None.  SPECIMENS:  None.  FINDINGS:  Intact lateral meniscus.  DISPOSITION:  To PACU.  CONDITION:  Stable.  BRIEF SUMMARY OF INDICATION FOR INDICATION:  Adam Morton is a 36 year old male who was involved in a horse play with friend, during which, he sustained acute injury to his right knee.  The patient was seen and evaluated, subsequently found to have a depression fracture of his plateau with involvement of the tibial eminence.  The patient was initially seen and evaluated by Dr. Shon Baton who felt that the complexity of the fracture warranted definitive management by a fellowship-trained orthopedic traumatologist and consequently consulted me for further care.  I discussed with the patient the risks and benefits of surgery including the possibility of infection, nerve injury, vessel injury, DVT, PE, malunion, nonunion, symptomatic hardware, need for further surgery, and multiple others, and the patient did wish to proceed.  BRIEF SUMMARY OF PROCEDURE:  The patient was taken to the operating room after administration of preoperative antibiotics.   His right lower extremity was prepped and draped in usual sterile fashion.  A curvilinear incision was made above the level of the joint and the periosteal attachment kept attached to the lateral rim incising along the most proximal aspect of the anterior compartment sweeping the muscle belly posteriorly for full access.  Attention was directed first at the joint where the retinaculum was protected the coronary ligament incised along its insertion to the plateau and 0 Prolene suture was used to retract it proximally.  This enabled for complete cleaning and visualization of the joint.  There was no tear in the meniscus.  A trapdoor was then made in the metaphyseal cortex and under direct visualization, a tamp was used to restore the lateral plateau surface to the correct height.  This was secured with several K-wires and then flexor putty inserted into the defect.  There did remain some areas of difficulty with respect to the complete fill of the void below the subchondral bone and consequently, this area was identified with a drill bit and then injectable calcium phosphate cement.  The beta-BSM system was injected into the area with excellent fill.  The plate was then checked for position on AP and lateral images found to be appropriate and driven across in line with the previous K-wires.  I did make certain that the tibial eminence was keyed in and anatomically reduced  prior to placement of the fixation, which did span the eminences well into the medial subchondral bone of the plateau.  An anterior compartment fasciotomy was then performed to reduce the chance of postoperative anterior compartment syndrome particularly given the Q window during which this fracture was fixed.  No tourniquet was used during the procedure.  The wound was irrigated thoroughly and closed in standard layered fashion with 2-0 Vicryl and nylon.  Sterile gently compressive dressing was applied and then a hinged  knee brace.  Montez Morita, PA-C assisted me throughout the procedure and was necessary for retracting the meniscus for assisting with visualization of the plateau reduction during elevation of the articular surface and also with wound closure.  PROGNOSIS:  Adam Morton will be nonweightbearing for the next 6-8 weeks, graduate weightbearing thereafter.  This over the pain and large part upon his ability to reduce and ceased smoking to move him towards the 6- week mark for early weightbearing.  His bone was softer than would be expected for age and consequently, we will pursue a further metabolic bone workup.  He will be on DVT prophylaxis with Lovenox while in the hospital and likely discharged on aspirin if not continuation of his 10- day Lovenox course.  He will have unrestricted range of motion and a hinged knee brace.     Doralee Albino. Carola Frost, M.D.     [facility]/MEDQ  D:  06/15/2012  T:  06/16/2012  Job:  [id number]

## 2012-06-16 NOTE — Progress Notes (Signed)
Orthopedic Tech Progress Note Patient Details:  Adam Morton 24-May-1976 [id number]  Patient ID: [name], male   DOB: 05-11-1976, 36 y.o.   MRN: [id number]   [name] 06/16/2012, 8:26 AM    RIGHT HINGED KNEE BRACE FIT BY [facility]

## 2012-06-16 NOTE — Progress Notes (Signed)
Orthopedic Tech Progress Note Patient Details:  Adam Morton 1976-12-04 [id number]  Ortho Devices Type of Ortho Device: Crutches Ortho Device/Splint Interventions: Application   [name] 06/16/2012, 11:54 AM

## 2012-06-16 NOTE — Progress Notes (Signed)
Physical Therapy Evaluation Note  History reviewed. No pertinent past medical history.  History reviewed. No pertinent past surgical history.   06/16/12 1011  PT Visit Information  Last PT Received On 06/23/12  Assistance Needed +1  PT Time Calculation  PT Start Time 1030  PT Stop Time 1116  PT Time Calculation (min) 46 min  Subjective Data  Subjective Pt received supine in bed with report of 7/10 R leg  Precautions  Required Braces or Orthoses (hinged knee brace on R LE, unrestricted ROM in brace)  Restrictions  RLE Weight Bearing NWB  Home Living  Lives With Alone  Available Help at Discharge Friend(s);Available PRN/intermittently Patient plans to go to girlfriends apartment but potentially stay at his during the day  Type of Home Apartment  Home Access Stairs to enter  Entrance Stairs-Number of Steps 25 (25 steps to get into girlfriends, 0 steps to get into his)  Entrance Stairs-Rails Can reach both  Home Layout One level- girlfriends apt (pt apt 2 story with full bath up stairs, 20 setps with L HR)  Bathroom Shower/Tub Biochemist, clinical  How Accessible (only via crutches)  Home Adaptive Equipment None  Prior Function  Level of Independence Independent  Able to Take Stairs? Yes  Driving Yes  Vocation Full time employment  Communication  Communication No difficulties  Cognition  Overall Cognitive Status Appears within functional limits for tasks assessed/performed  Arousal/Alertness Awake/alert  Orientation Level Appears intact for tasks assessed  Behavior During Session Anxious  Right Upper Extremity Assessment  RUE ROM/Strength/Tone WFL  Left Upper Extremity Assessment  LUE ROM/Strength/Tone WFL  Right Lower Extremity Assessment  RLE ROM/Strength/Tone Deficits;Due to pain;Due to precautions  RLE ROM/Strength/Tone Deficits ankle/hip WFL, pt tolerated approx 30-40 deg of R knee AA Flex in brace  Left Lower Extremity Assessment  LLE  ROM/Strength/Tone WFL  Trunk Assessment  Trunk Assessment Normal  Bed Mobility  Bed Mobility Supine to Sit  Supine to Sit HOB flat;4: Min assist  Details for Bed Mobility Assistance minA for R LE management due to limited tolerance of flex  Transfers  Transfers Sit to Stand;Stand to Sit  Sit to Stand 4: Min guard;With upper extremity assist  Stand to Sit 4: Min guard;With upper extremity assist;With armrests;To chair/3-in-1;To bed  Details for Transfer Assistance v/c's for crutch management and R LE NWB  Ambulation/Gait  Ambulation/Gait Assistance 4: Min guard  Ambulation Distance (Feet) 60 Feet  Assistive device Crutches  Ambulation/Gait Assistance Details pt 100% compliant with R LE NWB, pt demo'd safe sequencing with crutches  Gait Pattern Step-through pattern  Gait velocity WFL  Stairs Yes  Stairs Assistance 4: Min guard  Stair Management Technique One rail Right;Two rails;Backwards;With crutches (trialed both due to different settings at girlfriends and his personal apt)  Number of Stairs 4   Wheelchair Mobility  Wheelchair Mobility No  Exercises  Exercises (encouraged active R knee ROM in brace)  PT - End of Session  Equipment Utilized During Treatment Gait belt (R hinged knee brace)  Activity Tolerance Patient tolerated treatment well  Patient left in chair;with call bell/phone within reach;with family/visitor present  Nurse Communication Mobility status  PT Assessment  Clinical Impression Statement Pt s/p R LE ORIF for tibial plateau fx presenting with limited R knee flexion tolerance, decreased R LE strenght, and increased anxiety due to pain. Anticpate patient safe for d/c home tomorrow at projected by MD with assist of family/friends and use of crutches.   PT Recommendation/Assessment  Patient needs continued PT services  PT Problem List Decreased strength;Decreased range of motion;Decreased balance;Decreased activity tolerance  Barriers to Discharge Decreased  caregiver support (no one at home during the day)  Barriers to Discharge Comments project patient to be modifiend independent for most mobility and girlfriend can assist with bathing in the evenings  PT Therapy Diagnosis  Difficulty walking;Abnormality of gait;Generalized weakness;Acute pain  PT Plan  PT Frequency Min 5X/week  PT Treatment/Interventions DME instruction;Gait training;Stair training;Functional mobility training;Therapeutic activities;Therapeutic exercise  PT Recommendation  Follow Up Recommendations Supervision - Intermittent  Equipment Recommended (crutches- provided by [employer]otech)  Individuals Consulted  Consulted and Agree with Results and Recommendations Patient  Acute Rehab PT Goals  PT Goal Formulation With patient  Time For Goal Achievement 06/23/12  Potential to Achieve Goals Good  Pt will go Supine/Side to Sit Independently;with HOB 0 degrees  PT Goal: Supine/Side to Sit - Progress Goal set today  Pt will go Sit to Stand Independently  PT Goal: Sit to Stand - Progress Goal set today  Pt will go Stand to Sit Independently  PT Goal: Stand to Sit - Progress Goal set today  Pt will Ambulate >150 feet;Independently;with crutches  PT Goal: Ambulate - Progress Goal set today  Pt will Go Up / Down Stairs Flight;with supervision;with crutches (or bilat handrails)  PT Goal: Up/Down Stairs - Progress Goal set today  PT General Charges  $$ ACUTE PT VISIT 1 Procedure  PT Evaluation  $Initial PT Evaluation Tier II 1 Procedure  PT Treatments  $Gait Training 8-22 mins  Written Expression  Dominant Hand Right     Pain: 7-8/10 R knee pain  Lewis Shock, PT, DPT Pager #: [phone] Office #: [phone]

## 2012-06-16 NOTE — Progress Notes (Signed)
[facility]opaedic Trauma [facility] (OTS)  Subjective: 1 Day Post-Op Procedure(s) (LRB): OPEN REDUCTION INTERNAL FIXATION (ORIF) TIBIAL PLATEAU (Right)    Doing well Pain controlled Advancing diet Last BM Saturday Denies numbness or tingling No cp, no sob Patient some elevated blood pressure issues and postoperatively. I would use him on hydralazine in the PACU x1 and is also written for when necessary as well for her elevated blood pressures. Patient denies any headaches or visual changes at this current time.  Objective: Current Vitals Blood pressure 153/98, pulse 86, temperature 98.4 F (36.9 C), temperature source Oral, resp. rate 19, height 5\' 6"  (1.676 m), weight 70.761 kg (156 lb), SpO2 100.00%. Vital signs in last 24 hours: Temp:  [98.2 F (36.8 C)-99 F (37.2 C)] 98.4 F (36.9 C) (06/25 0602) Pulse Rate:  [75-111] 86  (06/25 0602) Resp:  [11-19] 19  (06/25 0602) BP: (132-181)/(82-121) 153/98 mmHg (06/25 0602) SpO2:  [96 %-100 %] 100 % (06/25 0602) Intake/Output      06/24 0701 - 06/25 0700 06/25 0701 - 06/26 0700   P.O.     I.V. (mL/kg) 1000 (14.1)    Total Intake(mL/kg) 1000 (14.1)    Urine (mL/kg/hr) 2950 (1.7)    Total Output 2950    Net -1950           Intake/Output from previous day: 06/24 0701 - 06/25 0700 In: 1000 [I.V.:1000] Out: 2950 [Urine:2950]  LABS  Basename 06/16/12 0540 06/13/12 1615 06/13/12 1153  HGB 13.9 15.6 15.3    Basename 06/16/12 0540 06/13/12 1615  WBC 8.4 13.1*  RBC 4.47 4.93  HCT 40.8 44.5  PLT 203 202    Basename 06/16/12 0540 06/13/12 1615  NA 135 137  K 3.8 4.3  CL 100 102  CO2 24 22  BUN 6 8  CREATININE 0.81 0.83  GLUCOSE 113* 89  CALCIUM 9.4 9.3    Basename 06/13/12 1615  LABPT --  INR 0.97    Physical Exam  Gen: Awake and alert no acute distress Lungs: Clear bilaterally Cardiac: Regular Abd: Soft nontender with positive bowel sounds Ext: Right lower extremity  Hinge knee brace in place and fitting  well  Loosened Ace wrap today and states it was slightly tight  A distal motor and sensory functions are intact  Extremity is warm  Palpable dorsalis pedis pulse  No pain with passive stretch  Swelling is controlled  Imaging   Dg Knee 2 Views Right  06/15/2012  *RADIOLOGY REPORT*  Clinical Data: 36 year old male undergoing ORIF of the right tibia for tibial plateau fracture.  RIGHT KNEE - 3 VIEW,DG C-ARM GT 120 MIN  Technique: Five intraoperative fluoroscopic spot views of the right proximal tibia.  Comparison:  Preoperative study 06/13/2012.  Findings: Images demonstrate a trocar placement along the lateral tibia at the level of the comminuted fracture. Fracture fragments are elevated.  Subsequently a lateral plate and cortical screw hardware is placed. Near anatomic alignment is noted.  IMPRESSION: ORIF right lateral tibial plateau fracture with no adverse features.  Original Report Authenticated By: Harley Hallmark, M.D.    Assessment/Plan: 1 Day Post-Op Procedure(s) (LRB): OPEN REDUCTION INTERNAL FIXATION (ORIF) TIBIAL PLATEAU (Right)  36 year old African American male with right lateral tibial plateau fracture  1. Right lateral tibial plateau fracture status post ORIF postoperative day #1  Nonweightbearing x6 weeks  Unrestricted range of motion of the right knee  PT and OT evaluations today  Dressing change tomorrow  Ice and elevate as needed 2. DVT/PE prophylaxis  Lovenox x10-14 days  Insurance may be an issue in terms of affordability if this is a problem I will discharge on aspirin 325 mg by mouth daily for 4-6 weeks 3. Pain  DC PCA  DC IV Tylenol  Start Percocet 04/24/2024 one to 2 by mouth every 6 hours when necessary pain as well as OxyIR 5 mg 1-2 by mouth Q3 hours for breakthrough pain 4. FEN  Diet as tolerated 5. substance abuse  Patient clearly aware of the negative effects of nicotine on his healing process. Continue to abstain from nicotine at all possible minimize  dose. 6. Hypertension  Continue with when necessary hydralazine.  If blood pressures are still elevated tomorrow will start on a low dose HCTZ and discharged such. The patient will need to follow up with his primary care doctor regarding his high blood pressure.   It is possible that this as been manifesting for a while and patient has not sought treatment for it. 7. Disposition  PT and OT evaluation today  Probable discharge home tomorrow  Mearl Latin, PA-C [facility] [phone] (P) 06/16/2012, 9:34 AM

## 2012-06-16 NOTE — Progress Notes (Signed)
Orthopedic Tech Progress Note Patient Details:  Adam Morton July 17, 1976 [id number] Trapeze bar Patient ID: Adam Morton, male   DOB: 1976-10-14, 36 y.o.   MRN: [id number]   [name] 06/16/2012, 10:26 AM

## 2012-06-17 LAB — BASIC METABOLIC PANEL
Calcium: 9.5 mg/dL (ref 8.4–10.5)
Creatinine, Ser: 0.86 mg/dL (ref 0.50–1.35)
GFR calc Af Amer: >90 mL/min (ref >90)

## 2012-06-17 LAB — PREALBUMIN: Prealbumin: 14 mg/dL — ABNORMAL LOW (ref 17.0–34.0)

## 2012-06-17 LAB — PHOSPHORUS: Phosphorus: 3.3 mg/dL (ref 2.3–4.6)

## 2012-06-17 MED ORDER — DSS 100 MG PO CAPS: 100.0000 mg | ORAL_CAPSULE | Freq: Two times a day (BID) | ORAL | Status: AC

## 2012-06-17 MED ORDER — OXYCODONE HCL 5 MG PO TABS: 5.0000 mg | ORAL_TABLET | ORAL | Status: AC | PRN

## 2012-06-17 MED ORDER — ASPIRIN EC 325 MG PO TBEC: 325.0000 mg | DELAYED_RELEASE_TABLET | Freq: Every day | ORAL | Status: AC

## 2012-06-17 MED ORDER — HYDROCHLOROTHIAZIDE 12.5 MG PO CAPS: 12.5000 mg | ORAL_CAPSULE | Freq: Every day | ORAL | Status: DC

## 2012-06-17 MED ORDER — METHOCARBAMOL 500 MG PO TABS: 500.0000 mg | ORAL_TABLET | Freq: Three times a day (TID) | ORAL | Status: AC | PRN

## 2012-06-17 MED ORDER — OXYCODONE-ACETAMINOPHEN 5-325 MG PO TABS: 1.0000 | ORAL_TABLET | Freq: Four times a day (QID) | ORAL | Status: AC | PRN

## 2012-06-17 MED ORDER — HYDROCHLOROTHIAZIDE 12.5 MG PO CAPS
12.5000 mg | ORAL_CAPSULE | Freq: Every day | ORAL | Status: DC
  Administered 2012-06-17: 12.5 mg via ORAL
  Filled 2012-06-17 (×2): qty 1

## 2012-06-17 NOTE — Progress Notes (Signed)
I have seen and examined the patient. I agree with the findings above.  Budd Palmer, MD 06/17/2012 9:17 AM

## 2012-06-17 NOTE — Progress Notes (Signed)
I agree with the findings above.  Budd Palmer, MD 06/17/2012 9:17 AM
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
## 2012-06-17 NOTE — Discharge Instructions (Signed)
Orthopaedic Trauma Service Discharge Instructions, Pin Site and Wound Care   General Discharge Instructions  WEIGHT BEARING STATUS: Nonweightbearing right leg, use crutches for ambulation  RANGE OF MOTION/ACTIVITY: Range of motion right knee as tolerated  Diet: as you were eating previously.  Can use over the counter stool softeners and bowel preparations, such as Miralax, to help with bowel movements.  Narcotics can be constipating.  Be sure to drink plenty of fluids  STOP SMOKING OR USING NICOTINE PRODUCTS!!!!  As discussed nicotine severely impairs your body's ability to heal surgical and traumatic wounds but also impairs bone healing.  Wounds and bone heal by forming microscopic blood vessels (angiogenesis) and nicotine is a vasoconstrictor (essentially, shrinks blood vessels).  Therefore, if vasoconstriction occurs to these microscopic blood vessels they essentially disappear and are unable to deliver necessary nutrients to the healing tissue.  This is one modifiable factor that you can do to dramatically increase your chances of healing your injury.    (This means no smoking, no nicotine gum, patches, etc)  DO NOT USE NONSTEROIDAL ANTI-INFLAMMATORY DRUGS (NSAID'S)  Using products such as Advil (ibuprofen), Aleve (naproxen), Motrin (ibuprofen) for additional pain control during fracture healing can delay and/or prevent the healing response.  If you would like to take over the counter (OTC) medication, Tylenol (acetaminophen) is ok.  However, some narcotic medications that are given for pain control contain acetaminophen as well. Therefore, you should not exceed more than 4000 mg of tylenol in a day if you do not have liver disease.  Also note that there are may OTC medicines, such as cold medicines and allergy medicines that my contain tylenol as well.  If you have any questions about medications and/or interactions please ask your doctor/PA or your pharmacist.   PAIN MEDICATION USE AND  EXPECTATIONS  You have likely been given narcotic medications to help control your pain.  After a traumatic event that results in an fracture (broken bone) with or without surgery, it is ok to use narcotic pain medications to help control one's pain.  We understand that everyone responds to pain differently and each individual patient will be evaluated on a regular basis for the continued need for narcotic medications. Ideally, narcotic medication use should last no more than 6-8 weeks (coinciding with fracture healing).   As a patient it is your responsibility as well to monitor narcotic medication use and report the amount and frequency you use these medications when you come to your office visit.   We would also advise that if you are using narcotic medications, you should take a dose prior to therapy to maximize you participation.  IF YOU ARE ON NARCOTIC MEDICATIONS IT IS NOT PERMISSIBLE TO OPERATE A MOTOR VEHICLE (MOTORCYCLE/CAR/TRUCK/MOPED) OR HEAVY MACHINERY DO NOT MIX NARCOTICS WITH OTHER CNS (CENTRAL NERVOUS SYSTEM) DEPRESSANTS SUCH AS ALCOHOL       ICE AND ELEVATE INJURED/OPERATIVE EXTREMITY  Using ice and elevating the injured extremity above your heart can help with swelling and pain control.  Icing in a pulsatile fashion, such as 20 minutes on and 20 minutes off, can be followed.    Do not place ice directly on skin. Make sure there is a barrier between to skin and the ice pack.    Using frozen items such as frozen peas works well as the conform nicely to the are that needs to be iced.  USE AN ACE WRAP OR TED HOSE FOR SWELLING CONTROL  In addition to icing and elevation, Ace wraps  or TED hose are used to help limit and resolve swelling.  It is recommended to use Ace wraps or TED hose until you are informed to stop.    When using Ace Wraps start the wrapping distally (farthest away from the body) and wrap proximally (closer to the body)   Example: If you had surgery on your leg or  thing and you do not have a splint on, start the ace wrap at the toes and work your way up to the thigh        If you had surgery on your upper extremity and do not have a splint on, start the ace wrap at your fingers and work your way up to the upper arm  IF YOU ARE IN A SPLINT OR CAST DO NOT REMOVE IT FOR ANY REASON   If your splint gets wet for any reason please contact the office immediately. You may shower in your splint or cast as long as you keep it dry.  This can be done by wrapping in a cast cover or garbage back (or similar)  Do Not stick any thing down your splint or cast such as pencils, money, or hangers to try and scratch yourself with.  If you feel itchy take benadryl as prescribed on the bottle for itching  CALL THE OFFICE WITH ANY QUESTIONS OR CONCERTS: [phone]     Discharge Pin Site Instructions  Dress pins daily with Kerlix roll starting on POD 2. Wrap the Kerlix so that it tamps the skin down around the pin-skin interface to prevent/limit motion of the skin relative to the pin.  (Pin-skin motion is the primary cause of pain and infection related to external fixator pin sites).  Remove any crust or coagulum that may obstruct drainage with a saline moistened gauze or soap and water.  After POD 3, if there is no discernable drainage on the pin site dressing, the interval for change can by increased to every other day.  You may shower with the fixator, cleaning all pin sites gently with soap and water.  If you have a surgical wound this needs to be completely dry and without drainage before showering.  The extremity can be lifted by the fixator to facilitate wound care and transfers.  Notify the office/Doctor if you experience increasing drainage, redness, or pain from a pin site, or if you notice purulent (thick, snot-like) drainage.  Discharge Wound Care Instructions  Do NOT apply any ointments, solutions or lotions to pin sites or surgical wounds.  These prevent  needed drainage and even though solutions like hydrogen peroxide kill bacteria, they also damage cells lining the pin sites that help fight infection.  Applying lotions or ointments can keep the wounds moist and can cause them to breakdown and open up as well. This can increase the risk for infection. When in doubt call the office.  Surgical incisions should be dressed daily.  If any drainage is noted, use one layer of adaptic, then gauze, Kerlix, and an ace wrap.  Once the incision is completely dry and without drainage, it may be left open to air out.  Showering may begin 36-48 hours later.  Cleaning gently with soap and water.  Traumatic wounds should be dressed daily as well.    One layer of adaptic, gauze, Kerlix, then ace wrap.  The adaptic can be discontinued once the draining has ceased    If you have a wet to dry dressing: wet the gauze with saline the squeeze  as much saline out so the gauze is moist (not soaking wet), place moistened gauze over wound, then place a dry gauze over the moist one, followed by Kerlix wrap, then ace wrap.

## 2012-06-17 NOTE — Progress Notes (Signed)
Physical Therapy Treatment Patient Details Name: Adam Morton MRN: [id number] DOB: Oct 05, 1976 Today's Date: 06/17/2012 Time: 4098-1191 PT Time Calculation (min): 15 min  PT Assessment / Plan / Recommendation Comments on Treatment Session  Pt moving very well.  Plans are to d/c home today.      Follow Up Recommendations  Supervision - Intermittent    Barriers to Discharge        Equipment Recommendations       Recommendations for Other Services    Frequency Min 5X/week   Plan Discharge plan remains appropriate    Precautions / Restrictions Restrictions RLE Weight Bearing: Non weight bearing       Mobility  Bed Mobility Bed Mobility: Supine to Sit;Sitting - Scoot to Edge of Bed Supine to Sit: 6: Modified independent (Device/Increase time);HOB flat Sitting - Scoot to Edge of Bed: 6: Modified independent (Device/Increase time) Transfers Transfers: Sit to Stand;Stand to Sit Sit to Stand: 6: Modified independent (Device/Increase time);With upper extremity assist;From bed Stand to Sit: 6: Modified independent (Device/Increase time);Without upper extremity assist;To chair/3-in-1 Ambulation/Gait Ambulation/Gait Assistance: 5: Supervision Ambulation Distance (Feet): 120 Feet Assistive device: Crutches Ambulation/Gait Assistance Details: Supervision for safety Gait Pattern: Step-through pattern Stairs: Yes Stairs Assistance: 5: Supervision Stairs Assistance Details (indicate cue type and reason): Supervision for safety Stair Management Technique: One rail Right;Two rails;Backwards;With crutches Number of Stairs: 4  Wheelchair Mobility Wheelchair Mobility: No    Exercises General Exercises - Lower Extremity Long Arc Quad: AROM;Right;10 reps Other Exercises Other Exercises: Knee Flexion   PT Diagnosis:    PT Problem List:   PT Treatment Interventions:     PT Goals Acute Rehab PT Goals Time For Goal Achievement: 06/23/12 Potential to Achieve Goals: Good PT Goal:  Supine/Side to Sit - Progress: Progressing toward goal PT Goal: Sit to Stand - Progress: Progressing toward goal PT Goal: Stand to Sit - Progress: Progressing toward goal PT Goal: Ambulate - Progress: Progressing toward goal PT Goal: Up/Down Stairs - Progress: Progressing toward goal  Visit Information  Last PT Received On: 06/17/12 Assistance Needed: +1    Subjective Data      Cognition  Overall Cognitive Status: Appears within functional limits for tasks assessed/performed Arousal/Alertness: Awake/alert Orientation Level: Appears intact for tasks assessed Behavior During Session: [facility] for tasks performed    Balance  Balance Balance Assessed: No  End of Session PT - End of Session Equipment Utilized During Treatment: Gait belt Activity Tolerance: Patient tolerated treatment well Patient left: in chair;with call bell/phone within reach;with family/visitor present Nurse Communication: Mobility status   GP     Adam Morton 06/17/2012, 3:01 PM  Adam Morton, PTA [phone] 06/17/2012

## 2012-06-17 NOTE — Progress Notes (Signed)
[facility] ([facility])  Subjective: 2 Days Post-Op Procedure(s) (LRB): OPEN REDUCTION INTERNAL FIXATION (ORIF) TIBIAL PLATEAU (Right)  Doing well Ready to go home No complaints  Objective: Current Vitals Blood pressure 158/98, pulse 95, temperature 99.6 F (37.6 C), temperature source Oral, resp. rate 18, height 5\' 6"  (1.676 m), weight 70.761 kg (156 lb), SpO2 100.00%. Vital signs in last 24 hours: Temp:  [97.7 F (36.5 C)-99.6 F (37.6 C)] 99.6 F (37.6 C) (06/26 0610) Pulse Rate:  [77-95] 95  (06/26 0610) Resp:  [18-20] 18  (06/26 0610) BP: (149-174)/(93-105) 158/98 mmHg (06/26 0653) SpO2:  [100 %] 100 % (06/26 0610)  Intake/Output from previous day: 06/25 0701 - 06/26 0700 In: 1920 [P.O.:1520; I.V.:400] Out: 500 [Urine:500]  LABS  Basename 06/16/12 0540  HGB 13.9    Basename 06/16/12 0540  WBC 8.4  RBC 4.47  HCT 40.8  PLT 203    Basename 06/17/12 0540 06/16/12 0540  NA 136 135  K 3.9 3.8  CL 100 100  CO2 23 24  BUN 7 6  CREATININE 0.86 0.81  GLUCOSE 99 113*  CALCIUM 9.5 9.4   No results found for this basename: LABPT:2,INR:2 in the last 72 hours  Physical Exam  ZOX:WRUEAVW well, NAD Lungs:clear Cardiac:reg Abd:+ BS Ext:  Right Lower Extremity  Incision looks fantastic, dry  No signs of infection  Ext is warm  Swelling controlled  No DCT  Compartments soft and nontender  Distal motor and sensory functions intact  + DP pulse     Assessment/Plan: 2 Days Post-Op Procedure(s) (LRB): OPEN REDUCTION INTERNAL FIXATION (ORIF) TIBIAL PLATEAU (Right)  36 year old African American male with right lateral tibial plateau fracture   1. Right lateral tibial plateau fracture status post ORIF postoperative day #1   Nonweightbearing x6 weeks   Unrestricted range of motion of the right knee   PT and OT evaluations today   Dressing change tomorrow   Ice and elevate as needed  2. DVT/PE prophylaxis   Pt without insurance and unable to  afford lovenox  ASA 325 mg po daily x 6 weeks 3. Pain   Percocet 5/325  Oxy IR  4. FEN   Diet as tolerated  5. substance abuse   Patient clearly aware of the negative effects of nicotine on his healing process. Continue to abstain from nicotine at all possible minimize dose.  6. Hypertension   Start low dose HCTZ for HTN, 12.5 mg po daily  Pt will need to follow up with/ establish care with a PCP to monitor  7. Disposition   D/c home today   Mearl Latin, PA-C [facility] [phone] (P) 06/17/2012, 8:17 AM

## 2012-06-18 ENCOUNTER — Encounter (HOSPITAL_COMMUNITY): Payer: Self-pay | Admitting: Orthopedic Surgery

## 2012-06-20 LAB — VITAMIN D 1,25 DIHYDROXY: Vitamin D 1, 25 (OH)2 Total: 123 pg/mL — ABNORMAL HIGH (ref 18–72)

## 2013-07-28 IMAGING — CT CT KNEE*R* W/O CM
3 series · 13 of 33 positions shown, 16 images · non-contrast
Comparison: Right knee radiographs 06/13/2012.

CLINICAL DATA: Evaluate tibial plateau fracture.

CT OF THE RIGHT KNEE WITHOUT CONTRAST
TECHNIQUE: Multidetector CT imaging was performed according to the
standard protocol. Multiplanar CT image reconstructions were also
generated.

[Series 4: knee 2.0 b31s · axial · 0.32mm/px · z∈[-235,-91]mm · 5 of 106 slices shown, 7 images]
[im 17/106  soft-tissue]
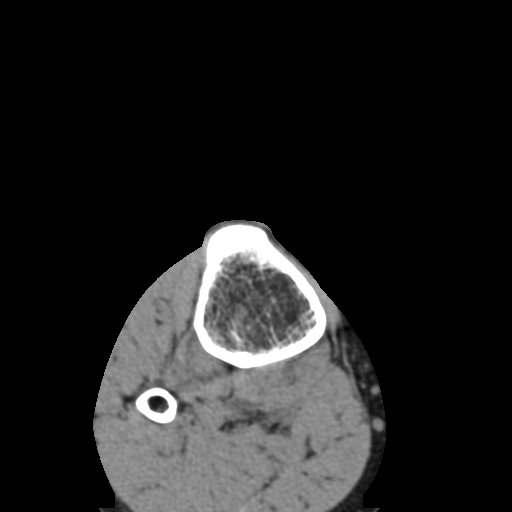
[im 17/106  bone]
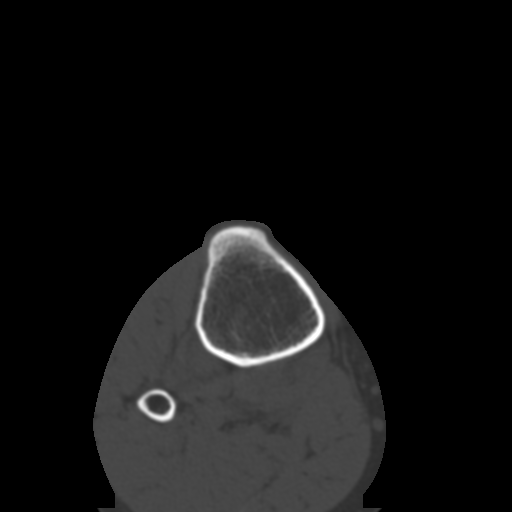
[im 33/106  bone]
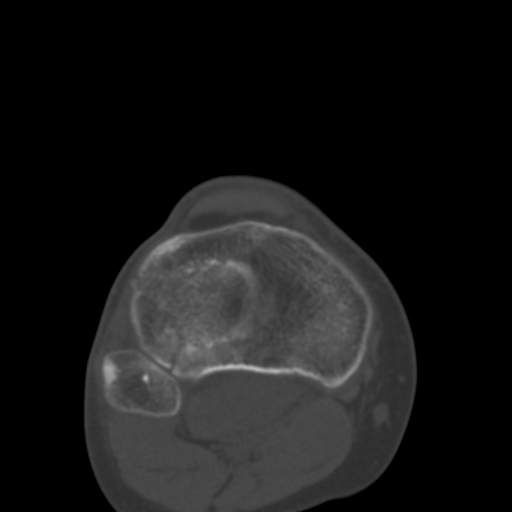
[im 57/106  bone]
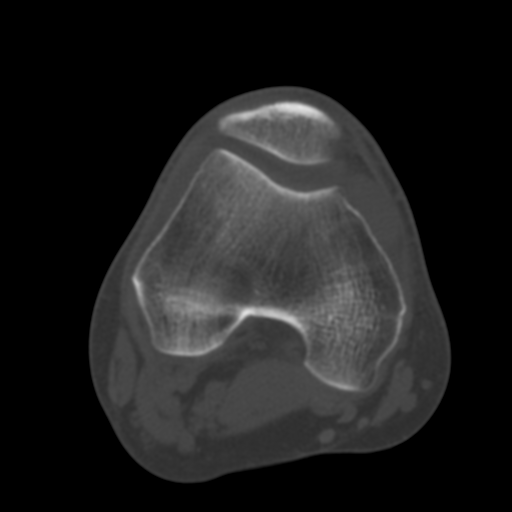
[im 73/106  bone]
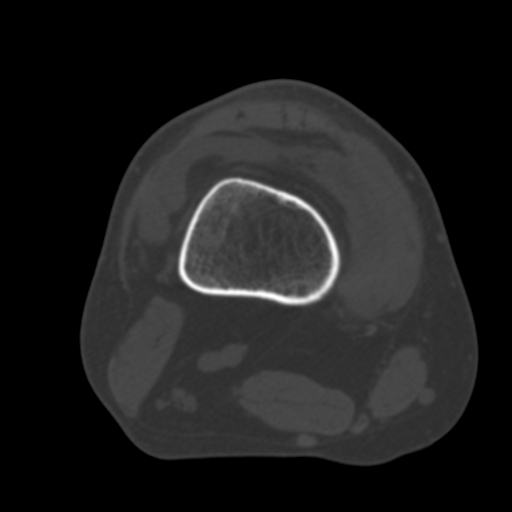
[im 89/106  soft-tissue]
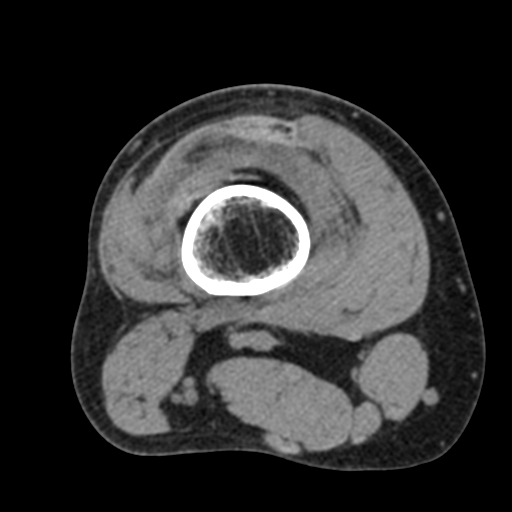
[im 89/106  bone]
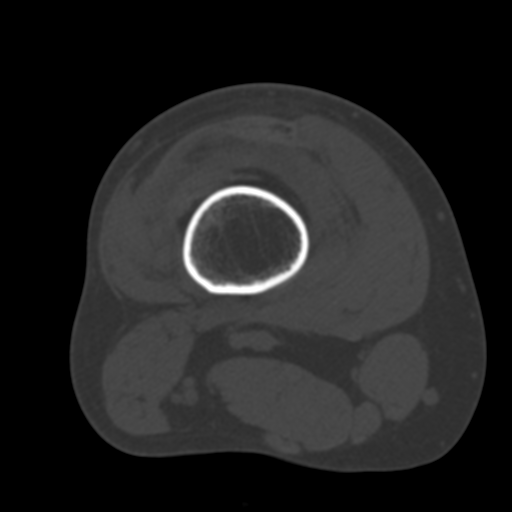

[Series 6: knee 2.0 coronal · coronal · 0.34mm/px · 3 of 85 slices shown]
[im 17/85  bone]
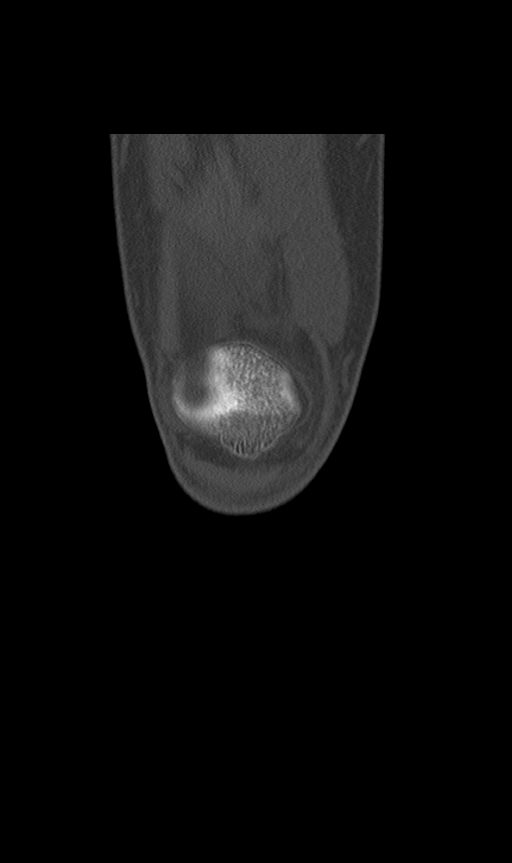
[im 34/85  bone]
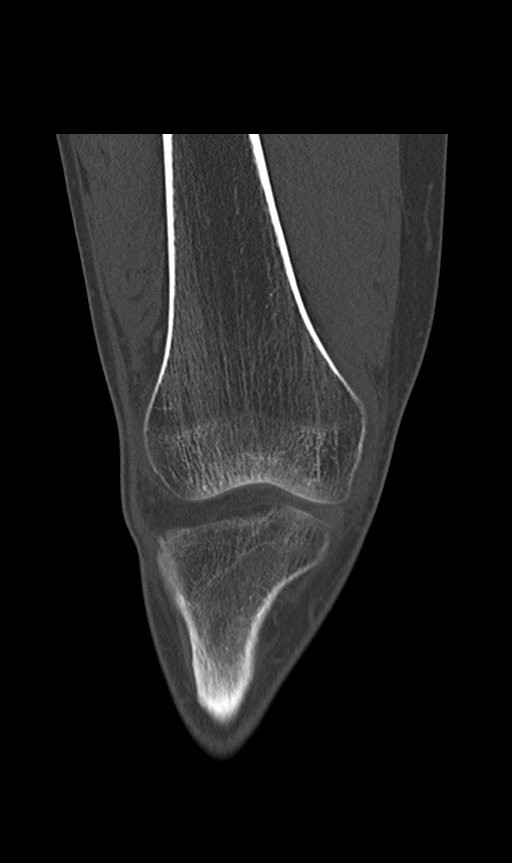
[im 51/85  bone]
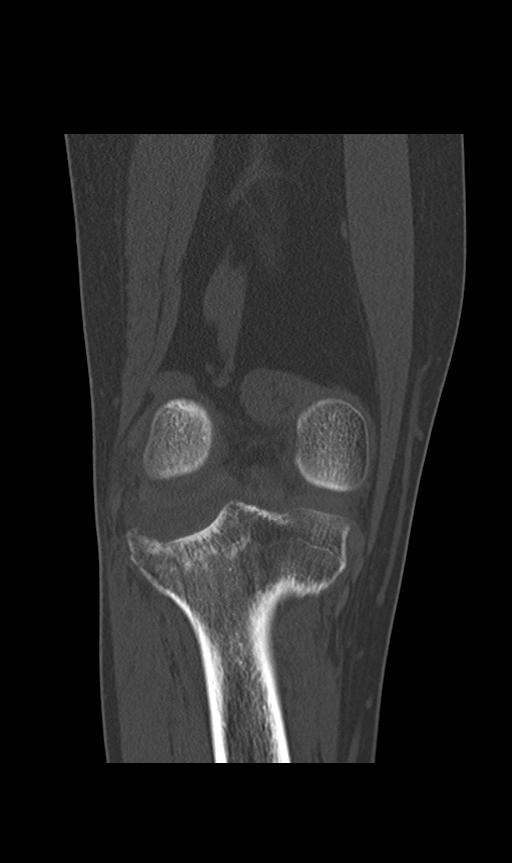

[Series 7: knee 2.0 sagittal · sagittal · 0.35mm/px · 5 of 65 slices shown, 6 images]
[im 22/65  bone]
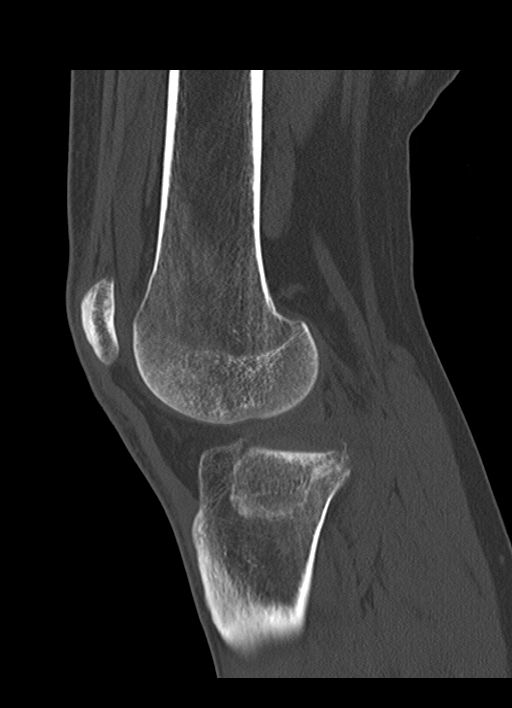
[im 27/65  bone]
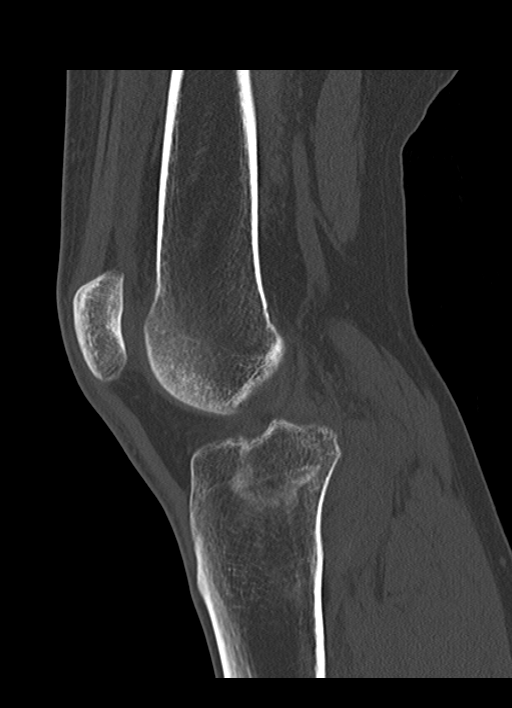
[im 33/65  soft-tissue]
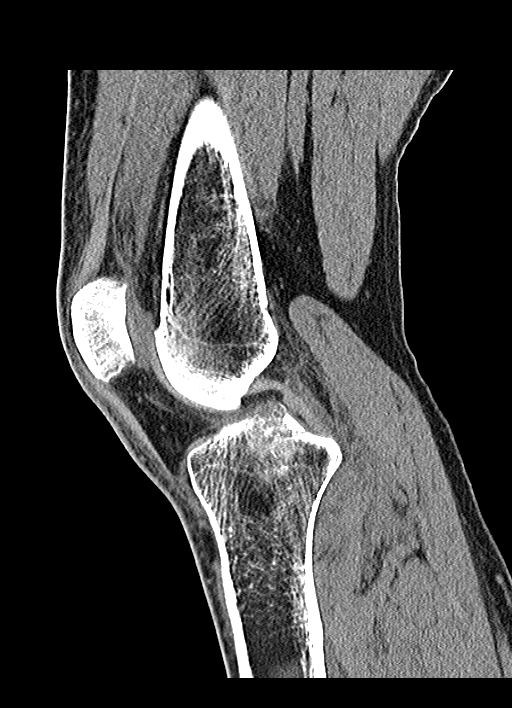
[im 33/65  bone]
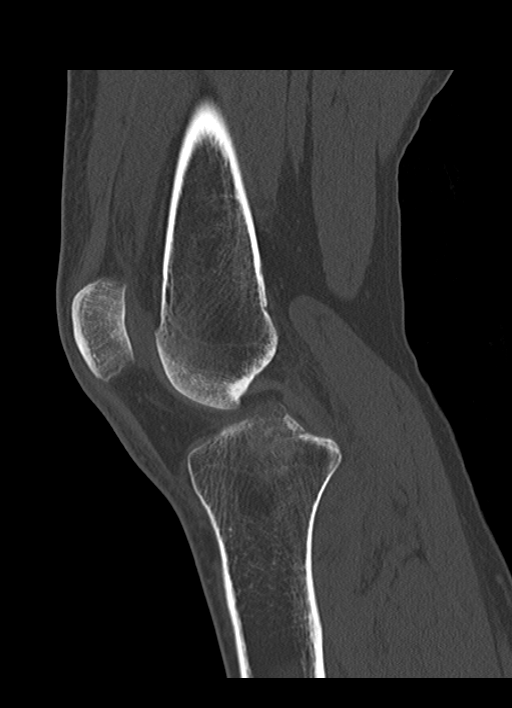
[im 38/65  bone]
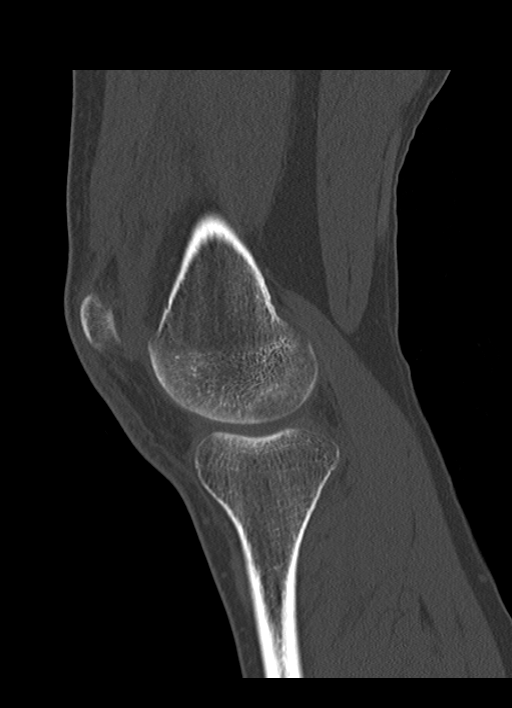
[im 43/65  bone]
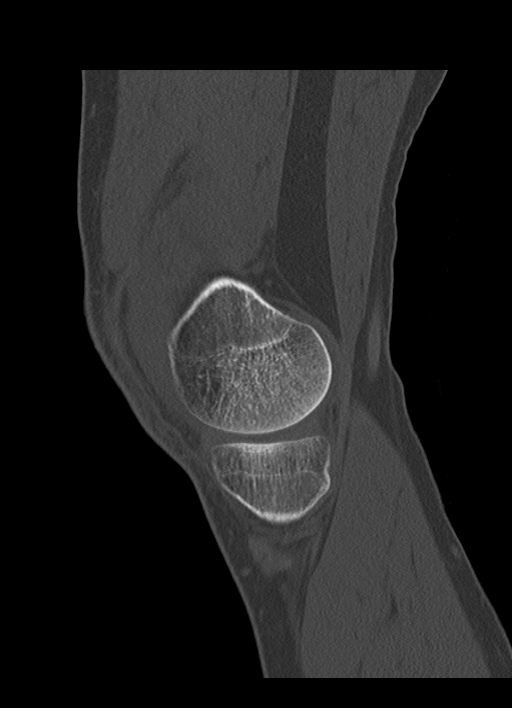

[13 of 33 positions shown; findings below may reference images not displayed]

FINDINGS: There is a depressed die punch type lateral tibial
plateau fracture with slight medial impaction of the depressed
fragment.  There is between 6 and 8 mm of depression.  The fracture
line extends through the medial tibial spine.  The medial tibial
plateau is intact.  No femur fracture and no fracture of the
fibula.

The ACL and PCL appear grossly intact and the medial and lateral
collateral ligaments appear intact.  There is a moderate sized
lipohemarthrosis.
IMPRESSION: Depressed die punch type lateral tibial plateau fracture with mild
medial impaction.

## 2014-12-19 ENCOUNTER — Ambulatory Visit (INDEPENDENT_AMBULATORY_CARE_PROVIDER_SITE_OTHER): Payer: Medicare HMO | Admitting: Family Medicine

## 2014-12-19 ENCOUNTER — Encounter: Payer: Self-pay | Admitting: Family Medicine

## 2014-12-19 VITALS — BP 159/105 | HR 76 | Temp 98.5°F | Ht 67.0 in | Wt 161.8 lb

## 2014-12-19 DIAGNOSIS — Z720 Tobacco use: Secondary | ICD-10-CM

## 2014-12-19 DIAGNOSIS — F17200 Nicotine dependence, unspecified, uncomplicated: Secondary | ICD-10-CM

## 2014-12-19 DIAGNOSIS — I10 Essential (primary) hypertension: Secondary | ICD-10-CM

## 2014-12-19 MED ORDER — HYDROCHLOROTHIAZIDE 12.5 MG PO CAPS: 12.5000 mg | ORAL_CAPSULE | Freq: Every day | ORAL | Status: AC

## 2014-12-19 NOTE — Assessment & Plan Note (Signed)
Has tried chantix in the past but is not ready to quit today. Will pursue as patient wants to be more healthy.

## 2014-12-19 NOTE — Patient Instructions (Signed)
Thank you for coming in,   Please follow up with me in 2 months to check your blood pressure after starting a medication.   Any time you need help with stopping smoking 1-800-QUIT-NOW   Please feel free to call with any questions or concerns at any time, at [phone]. --Dr. Jordan LikesSchmitz

## 2014-12-19 NOTE — Progress Notes (Signed)
   Subjective:    Patient ID: Adam Morton, male    DOB: February 16, 1976, 38 y.o.   MRN: [id number]  HPI  Adam Morton is here for est care.   HTN Disease Monitoring: Home BP Monitoring none Chest pain- no    Dyspnea- no Medications:none Compliance-  no. Lightheadedness-  none  Edema- none  Tobacco abuse: tried to quit with chantix before. Helped while taking it. Not ready to quit today.     Current Outpatient Prescriptions on File Prior to Visit  Medication Sig Dispense Refill  . hydrochlorothiazide (MICROZIDE) 12.5 MG capsule Take 1 capsule (12.5 mg total) by mouth daily. 30 capsule 1   No current facility-administered medications on file prior to visit.    SHx: drinking occasionally   Health Maintenance: up to date.    Review of Systems See HPI     Objective:   Physical Exam BP 166/105 mmHg  Pulse 82  Temp(Src) 98.5 F (36.9 C) (Oral)  Ht 5\' 7"  (1.702 m)  Wt 161 lb 12.8 oz (73.392 kg)  BMI 25.34 kg/m2 Gen: NAD, alert, cooperative with exam, well-appearing HEENT: NCAT, PERRL, clear conjunctiva CV: RRR, good S1/S2, no murmur, no edema, capillary refill brisk  Resp: CTABL, no wheezes, non-labored Skin: no rashes, normal turgor  Neuro: no gross deficits.  Psych: good insight, alert and oriented  BP recheck: 159/105.       Assessment & Plan:

## 2014-12-20 ENCOUNTER — Telehealth: Payer: Self-pay | Admitting: *Deleted

## 2014-12-20 NOTE — Telephone Encounter (Signed)
Girlfriend of patient would like a call back concerning why patient didn't receive a complete physical at his NP appt yesterday.  I explained to her that provider did his visit to establish care.  Pt or Ms. Adam Morton would like a call back if patient can come in and get lab work for a physical.  They were wanting this done before the end of the year for insurance purposes.   Please advise. Jazmin Hartsell,CMA

## 2014-12-20 NOTE — Telephone Encounter (Signed)
pts girlfriend calling again, says the visit pt came for has to be coded as a physical and not a new pt visit or insurance will not pay.

## 2014-12-21 NOTE — Telephone Encounter (Signed)
Spoke with patient's girlfriend on 12/29 and she will make an appointment for patient to come in for a cpe

## 2015-11-20 ENCOUNTER — Ambulatory Visit (INDEPENDENT_AMBULATORY_CARE_PROVIDER_SITE_OTHER): Payer: Managed Care, Other (non HMO) | Admitting: Family Medicine

## 2015-11-20 VITALS — BP 140/82 | HR 85 | Temp 98.4°F | Ht 67.0 in | Wt 162.5 lb

## 2015-11-20 DIAGNOSIS — I10 Essential (primary) hypertension: Secondary | ICD-10-CM | POA: Diagnosis not present

## 2015-11-20 DIAGNOSIS — R21 Rash and other nonspecific skin eruption: Secondary | ICD-10-CM | POA: Insufficient documentation

## 2015-11-20 DIAGNOSIS — N529 Male erectile dysfunction, unspecified: Secondary | ICD-10-CM

## 2015-11-20 DIAGNOSIS — Z114 Encounter for screening for human immunodeficiency virus [HIV]: Secondary | ICD-10-CM

## 2015-11-20 DIAGNOSIS — Z0000 Encounter for general adult medical examination without abnormal findings: Secondary | ICD-10-CM | POA: Insufficient documentation

## 2015-11-20 DIAGNOSIS — F17200 Nicotine dependence, unspecified, uncomplicated: Secondary | ICD-10-CM

## 2015-11-20 DIAGNOSIS — E559 Vitamin D deficiency, unspecified: Secondary | ICD-10-CM

## 2015-11-20 DIAGNOSIS — F17210 Nicotine dependence, cigarettes, uncomplicated: Secondary | ICD-10-CM

## 2015-11-20 LAB — COMPLETE METABOLIC PANEL WITH GFR
ALT: 26 U/L (ref 9–46)
AST: 20 U/L (ref 10–40)
Albumin: 4.4 g/dL (ref 3.6–5.1)
Alkaline Phosphatase: 76 U/L (ref 40–115)
BILIRUBIN TOTAL: 0.5 mg/dL (ref 0.2–1.2)
BUN: 11 mg/dL (ref 7–25)
CALCIUM: 9.7 mg/dL (ref 8.6–10.3)
CHLORIDE: 102 mmol/L (ref 98–110)
CO2: 22 mmol/L (ref 20–31)
CREATININE: 0.95 mg/dL (ref 0.60–1.35)
GFR, Est Non African American: >89 mL/min (ref >=60)
Glucose, Bld: 99 mg/dL (ref 65–99)
Potassium: 4.5 mmol/L (ref 3.5–5.3)
Sodium: 136 mmol/L (ref 135–146)
TOTAL PROTEIN: 7.4 g/dL (ref 6.1–8.1)

## 2015-11-20 LAB — LIPID PANEL
Cholesterol: 202 mg/dL — ABNORMAL HIGH (ref 125–200)
HDL: 58 mg/dL (ref >=40)
LDL CALC: 127 mg/dL (ref <130)
TRIGLYCERIDES: 83 mg/dL (ref <150)
Total CHOL/HDL Ratio: 3.5 Ratio (ref <=5.0)
VLDL: 17 mg/dL (ref <30)

## 2015-11-20 LAB — CBC
HEMATOCRIT: 48.9 % (ref 39.0–52.0)
Hemoglobin: 16.9 g/dL (ref 13.0–17.0)
MCH: 31 pg (ref 26.0–34.0)
MCHC: 34.6 g/dL (ref 30.0–36.0)
MCV: 89.7 fL (ref 78.0–100.0)
MPV: 11.7 fL (ref 8.6–12.4)
Platelets: 200 10*3/uL (ref 150–400)
RBC: 5.45 MIL/uL (ref 4.22–5.81)
RDW: 13 % (ref 11.5–15.5)
WBC: 6.4 10*3/uL (ref 4.0–10.5)

## 2015-11-20 LAB — TSH: TSH: 1.752 u[IU]/mL (ref 0.350–4.500)

## 2015-11-20 NOTE — Progress Notes (Signed)
Subjective:    Adam Morton - 39 y.o. male MRN [id number]  Date of birth: 1976/03/25  HPI  Adam Morton is here for physical.   He is an otherwise healthy male that's not taking any medications currently. 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  Rash on lip:  Has been present for about a month  Has tried OTC hydrocortisone for about a month  No pain or itching  Seems to be improving  Nothing on the inside of his mouth   ED:  Started about 6 months ago No problems with obtaining an erection  Doesn't wake up with an erection  Still has the libido  Denies any numbness  Is able to achieve ejaculation No concerns with relationship No chance of having any STD and prior history of gonorrhea 20 years ago.   No pain with bowel movements or penile discharge.  No scrotal pain  No trauma to the area  Having some stress in his finances recently Drinks 3 drinks per day of vodka  Smokes 1 PPD for the past 25 years  Denies any symptoms of leg claudication.   Tobacco abuse:  Has tried chantix in the past but restarted  Not ready to stop  When he quit previously it lasted for about a week  He went to the bar and it started it smoking again.   HTN Disease Monitoring: Home BP Monitoring randomly   Medications:HCTZ  Chest pain- no     Dyspnea- no Compliance-  no.  Lightheadedness-  no   Edema- no    Health Maintenance:  Obtain HIV  Health Maintenance Due  Topic Date Due  . HIV Screening  06/21/1992    -  reports that he has been smoking Cigarettes.  He has a 20 pack-year smoking history. He has never used smokeless tobacco. - Review of Systems: Per HPI. - Past Medical History: Patient Active Problem List   Diagnosis Date Noted  . Erectile dysfunction 11/20/2015  . Rash and nonspecific skin eruption 11/20/2015  . Health care maintenance 11/20/2015  . Alcohol use (HCC) 06/16/2012  . HTN (hypertension) 06/16/2012  . TOBACCO USE 10/15/2010  . FATIGUE  10/15/2010   - Medications: reviewed and updated Current Outpatient Prescriptions  Medication Sig Dispense Refill  . hydrochlorothiazide (MICROZIDE) 12.5 MG capsule Take 1 capsule (12.5 mg total) by mouth daily. (Patient not taking: Reported on 11/20/2015) 30 capsule 1   No current facility-administered medications for this visit.     Review of Systems See HPI     Objective:   Physical Exam BP 140/82 mmHg  Pulse 85  Temp(Src) 98.4 F (36.9 C) (Oral)  Ht 5\' 7"  (1.702 m)  Wt 162 lb 8 oz (73.71 kg)  BMI 25.45 kg/m2 Gen: NAD, alert, cooperative with exam, well-appearing HEENT: NCAT, PERRL, hyperpigmented skin on upper right and lower left border lips  CV: RRR, good S1/S2, no murmur, no edema,  Resp: CTABL, no wheezes, non-labored Abd: SNTND, BS present, no guarding or organomegaly Skin: no rashes, normal turgor  Neuro: no gross deficits.  Psych: alert and oriented      Assessment & Plan:   HTN (hypertension) Controlled without medications - Will continue without medications with control through diet and exercise - Labs today  TOBACCO USE 25 year history with no indication of quitting - Counseled today - Advised follow-up if considering methods of quitting  Erectile dysfunction Long tobacco use history and has increased the amount of alcohol intake  in recent months. 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 If no decrease and he will follow-up - optimize medical management if labs are abnormal  - If following up need to consider other options such as obtaining ABIs for peripheral vascular disease versus versus FSH and LH vs PHQ-9 or GAD-7   Rash and nonspecific skin eruption Most likely related to the barber trimming his beard.  Will continue with OTC hydrocortisone  - he will  follow up if no improvement   Health care maintenance Denied flu shot today Labs pending - Follow-up in 1 year for next health maintenance exam

## 2015-11-20 NOTE — Assessment & Plan Note (Signed)
25 year history with no indication of quitting - Counseled today - Advised follow-up if considering methods of quitting

## 2015-11-20 NOTE — Assessment & Plan Note (Signed)
Denied flu shot today Labs pending - Follow-up in 1 year for next health maintenance exam

## 2015-11-20 NOTE — Patient Instructions (Signed)
Thank you for coming in,   Please cut down on the alcohol that you drink and see if you notice a change.   If you are still having symptoms then please follow up with me.   I will call or send a letter with the results from today.   Please bring all of your medications with you to each visit.   Sign up for My Chart to have easy access to your labs results, and communication with your Primary care physician   Please feel free to call with any questions or concerns at any time, at [phone]. --Dr. Jordan LikesSchmitz

## 2015-11-20 NOTE — Assessment & Plan Note (Signed)
Long tobacco use history and has increased the amount of alcohol intake in recent months. 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 If no decrease and he will follow-up - optimize medical management if labs are abnormal  - If following up need to consider other options such as obtaining ABIs for peripheral vascular disease versus versus FSH and LH vs PHQ-9 or GAD-7

## 2015-11-20 NOTE — Assessment & Plan Note (Signed)
Most likely related to the barber trimming his beard.  Will continue with OTC hydrocortisone  - he will follow up if no improvement

## 2015-11-20 NOTE — Assessment & Plan Note (Signed)
Controlled without medications - Will continue without medications with control through diet and exercise - Labs today

## 2015-11-21 LAB — HIV ANTIBODY (ROUTINE TESTING W REFLEX): HIV: NONREACTIVE

## 2015-11-21 LAB — VITAMIN D 25 HYDROXY (VIT D DEFICIENCY, FRACTURES): VIT D 25 HYDROXY: 7 ng/mL — AB (ref 30–100)

## 2015-11-22 ENCOUNTER — Telehealth: Payer: Self-pay | Admitting: Family Medicine

## 2015-11-22 DIAGNOSIS — E559 Vitamin D deficiency, unspecified: Secondary | ICD-10-CM

## 2015-11-22 MED ORDER — VITAMIN D (ERGOCALCIFEROL) 1.25 MG (50000 UNIT) PO CAPS
50000.0000 [IU] | ORAL_CAPSULE | ORAL | Status: AC
Start: 2015-11-22 — End: ?

## 2015-11-22 NOTE — Telephone Encounter (Signed)
Spoke with patient about his results. Will send in his vitamin D.   Adam RudeJeremy E Schmitz, MD PGY-3, [facility] 11/22/2015, 8:52 AM

## 2016-11-27 ENCOUNTER — Emergency Department (HOSPITAL_BASED_OUTPATIENT_CLINIC_OR_DEPARTMENT_OTHER)
Admission: EM | Admit: 2016-11-27 | Discharge: 2016-11-27 | Disposition: A | Discharge status: Home / Self Care | Payer: Managed Care, Other (non HMO) | Attending: Emergency Medicine | Admitting: Emergency Medicine

## 2016-11-27 ENCOUNTER — Encounter (HOSPITAL_BASED_OUTPATIENT_CLINIC_OR_DEPARTMENT_OTHER): Payer: Self-pay | Admitting: Emergency Medicine

## 2016-11-27 DIAGNOSIS — L02415 Cutaneous abscess of right lower limb: Secondary | ICD-10-CM | POA: Insufficient documentation

## 2016-11-27 DIAGNOSIS — F17210 Nicotine dependence, cigarettes, uncomplicated: Secondary | ICD-10-CM | POA: Diagnosis not present

## 2016-11-27 DIAGNOSIS — I10 Essential (primary) hypertension: Secondary | ICD-10-CM | POA: Insufficient documentation

## 2016-11-27 DIAGNOSIS — L0201 Cutaneous abscess of face: Secondary | ICD-10-CM | POA: Insufficient documentation

## 2016-11-27 DIAGNOSIS — N492 Inflammatory disorders of scrotum: Secondary | ICD-10-CM | POA: Insufficient documentation

## 2016-11-27 DIAGNOSIS — L02818 Cutaneous abscess of other sites: Secondary | ICD-10-CM

## 2016-11-27 DIAGNOSIS — R21 Rash and other nonspecific skin eruption: Secondary | ICD-10-CM | POA: Diagnosis present

## 2016-11-27 MED ORDER — DOXYCYCLINE HYCLATE 100 MG PO CAPS: 100.0000 mg | ORAL_CAPSULE | Freq: Two times a day (BID) | ORAL | 0 refills | Status: DC

## 2016-11-27 MED FILL — DOXYCYCLINE MONO 100 MG CAP: 100 | 10 days supply | Qty: 20 | Fill #0

## 2016-11-27 NOTE — ED Triage Notes (Signed)
Pt is concerned about a bump to his L temporal area, L thigh area and rash to scrotum. Pt states all this started Sunday. Denies pain or itching.

## 2016-11-27 NOTE — ED Provider Notes (Signed)
[facility] Note   CSN: [id number] Arrival date & time: 11/27/16  [id number]  History   Chief Complaint No chief complaint on file.   HPI Adam Morton is a 40 y.o. male.  HPI 40 y.o. male with a hx of HTN, presents to the Emergency Department today complaining of rash to body. Pt noted on Sunday that small lesions appeared on left temple, left thigh, and right scrotum. Occurred on different occasions. Notes no fevers. No pain. No itching. No purulence. No discharge. No CP/SOB/ABD pain. No N/V/D. No chills/diaphoresis. No dysuria. No new detergents. No new medications. No other symptoms noted .  No past medical history on file.  Patient Active Problem List   Diagnosis Date Noted  . Erectile dysfunction 11/20/2015  . Rash and nonspecific skin eruption 11/20/2015  . Health care maintenance 11/20/2015  . Alcohol use 06/16/2012  . HTN (hypertension) 06/16/2012  . TOBACCO USE 10/15/2010  . FATIGUE 10/15/2010    Past Surgical History:  Procedure Laterality Date  . ORIF TIBIA PLATEAU  06/15/2012   Procedure: OPEN REDUCTION INTERNAL FIXATION (ORIF) TIBIAL PLATEAU;  Surgeon: Budd PalmerMichael H Handy, MD;  Location: [facility] OR;  Service: Orthopedics;  Laterality: Right;  ORIF tibial plateau       Home Medications    Prior to Admission medications   Medication Sig Start Date End Date Taking? Authorizing Provider  hydrochlorothiazide (MICROZIDE) 12.5 MG capsule Take 1 capsule (12.5 mg total) by mouth daily. Patient not taking: Reported on 11/20/2015 12/19/14 12/19/15  Myra RudeJeremy E Schmitz, MD  Vitamin D, Ergocalciferol, (DRISDOL) 50000 UNITS CAPS capsule Take 1 capsule (50,000 Units total) by mouth every 7 (seven) days. 11/22/15   Myra RudeJeremy E Schmitz, MD    Family History No family history on file.  Social History Social History  Substance Use Topics  . Smoking status: Current Every Day Smoker    Packs/day: 1.00    Years: 20.00    Types: Cigarettes  . Smokeless tobacco:  Never Used  . Alcohol use 0.0 oz/week     Comment: occassionally at night. twice a week. Vodka      Allergies   Patient has no known allergies.   Review of Systems Review of Systems  Constitutional: Negative for fever.  Gastrointestinal: Negative for diarrhea, nausea and vomiting.  Genitourinary: Negative for discharge, penile pain, scrotal swelling and testicular pain.  Musculoskeletal: Negative for arthralgias and myalgias.  Skin: Positive for rash.  Allergic/Immunologic: Negative for immunocompromised state.     Physical Exam Updated Vital Signs There were no vitals taken for this visit.  Physical Exam  Constitutional: He is oriented to person, place, and time. Vital signs are normal. He appears well-developed and well-nourished.  HENT:  Head: Normocephalic.  Right Ear: Hearing normal.  Left Ear: Hearing normal.  Eyes: Conjunctivae and EOM are normal. Pupils are equal, round, and reactive to light.  Neck: Normal range of motion. Neck supple.  Cardiovascular: Normal rate, regular rhythm, normal heart sounds and intact distal pulses.   Pulmonary/Chest: Effort normal and breath sounds normal.  Neurological: He is alert and oriented to person, place, and time.  Skin: Skin is warm and dry.  Scattered <0.5cm cutaneous abscesses noted on left thigh, left temple, right scrotum. No purulence. No erythema. No significant signs of infection.   Psychiatric: He has a normal mood and affect. His speech is normal and behavior is normal. Thought content normal.  Nursing note and vitals reviewed.  ED Treatments / Results  Labs (all  labs ordered are listed, but only abnormal results are displayed) Labs Reviewed - No data to display  EKG  EKG Interpretation None       Radiology No results found.  Procedures Procedures (including critical care time)  Medications Ordered in ED Medications - No data to display   Initial Impression / Assessment and Plan / ED Course  I have  reviewed the triage vital signs and the nursing notes.  Pertinent labs & imaging results that were available during my care of the patient were reviewed by me and considered in my medical decision making (see chart for details).  Clinical Course    Final Clinical Impressions(s) / ED Diagnoses     {I have reviewed the relevant previous healthcare records.  {I obtained HPI from historian.   ED Course:  Assessment: Pt is a 40yM with hx HTN who presents with rash since Sunday. On exam, pt in NAD. Nontoxic/nonseptic appearing. VSS. Afebrile. Area of concern without significant signs of infection. No erythema. No purulence. Areas are not painful. Likely cutaneous abscess. Given trial of ABX and follow up to Dermatology. Pt also noted to have High BP. Counseled on medications, but pt declines and does not want to take medications like he has in the past. Counseled on risk of continued HTN. At time of discharge, Patient is in no acute distress. Vital Signs are stable. Patient is able to ambulate. Patient able to tolerate PO.   Disposition/Plan:  DC Home Additional Verbal discharge instructions given and discussed with patient.  Pt Instructed to f/u with Dermatology/PCP in the next week for evaluation and treatment of symptoms. Return precautions given Pt acknowledges and agrees with plan  Supervising Physician Vanetta MuldersScott Zackowski, MD  Final diagnoses:  Cutaneous abscess of other site    New Prescriptions New Prescriptions   No medications on file     Audry Piliyler Joscelyne Renville, PA-C 11/27/16 0940    Vanetta MuldersScott Zackowski, MD 11/27/16 1210

## 2016-11-27 NOTE — Discharge Instructions (Signed)
Please read and follow all provided instructions.  Your diagnoses today include:  1. Cutaneous abscess of other site     Tests performed today include: Vital signs. See below for your results today.   Medications prescribed:  Take as prescribed   Home care instructions:  Follow any educational materials contained in this packet.  Follow-up instructions: Please follow-up with your Dermatology for further evaluation of symptoms and treatment   Return instructions:  Please return to the Emergency Department if you do not get better, if you get worse, or new symptoms OR  - Fever (temperature greater than 101.18F)  - Bleeding that does not stop with holding pressure to the area    -Severe pain (please note that you may be more sore the day after your accident)  - Chest Pain  - Difficulty breathing  - Severe nausea or vomiting  - Inability to tolerate food and liquids  - Passing out  - Skin becoming red around your wounds  - Change in mental status (confusion or lethargy)  - New numbness or weakness    Please return if you have any other emergent concerns.  Additional Information:  Your vital signs today were: BP (!) 172/112 (BP Location: Left Wrist)    Pulse 74    Temp 97.7 F (36.5 C) (Oral)    Resp 18    Ht 5\' 7"  (1.702 m)    Wt 70.8 kg    SpO2 99%    BMI 24.43 kg/m  If your blood pressure (BP) was elevated above 135/85 this visit, please have this repeated by your doctor within one month. ---------------

## 2017-01-15 ENCOUNTER — Encounter: Payer: Self-pay | Admitting: Family Medicine

## 2017-01-15 ENCOUNTER — Ambulatory Visit (INDEPENDENT_AMBULATORY_CARE_PROVIDER_SITE_OTHER): Payer: Managed Care, Other (non HMO) | Admitting: Family Medicine

## 2017-01-15 VITALS — BP 132/98 | HR 96 | Temp 98.0°F | Wt 157.0 lb

## 2017-01-15 DIAGNOSIS — I10 Essential (primary) hypertension: Secondary | ICD-10-CM

## 2017-01-15 DIAGNOSIS — L0291 Cutaneous abscess, unspecified: Secondary | ICD-10-CM

## 2017-01-15 MED ORDER — DOXYCYCLINE HYCLATE 100 MG PO TABS: 100.0000 mg | ORAL_TABLET | Freq: Two times a day (BID) | ORAL | 0 refills | Status: AC

## 2017-01-15 NOTE — Assessment & Plan Note (Signed)
Pt's BP elevated, he does not wish to discuss this and is not on any medications (he does not like them)

## 2017-01-15 NOTE — Progress Notes (Signed)
Subjective: CC: bump under L axilla  HPI: Patient is a 41 y.o. male presenting to clinic today for a bump under his L axilla.  Boil/bump under L axilla: noted 1 week ago (1/16). It seems to be getting larger and is tender to touch and pruritic. Bump is not erythematous, flesh-colored in nature. Maybe a small amount of drainage but he hasn't seen it. The area is not warm to touch.  Never had boils under axilla before. He's had chills previously but no fevers. He does note a recent h/o congestion and cough as well which may be why he had chills (these symptoms occurred around the same time as the bump).   Of note, he was treated on 12/6 from the ED for cutaneous abscesses on the left thigh, left temple, and R scrotum with doxycycline. He does not feel like this bump is like those.   Social History: 1ppd  ROS: All other systems reviewed and are negative.  Past Medical History Patient Active Problem List   Diagnosis Date Noted  . Abscess 01/15/2017  . Erectile dysfunction 11/20/2015  . Rash and nonspecific skin eruption 11/20/2015  . Health care maintenance 11/20/2015  . Alcohol use 06/16/2012  . HTN (hypertension) 06/16/2012  . TOBACCO USE 10/15/2010  . FATIGUE 10/15/2010    Medications- reviewed and updated  Objective: Office vital signs reviewed. BP (!) 132/98   Pulse 96   Temp 98 F (36.7 C) (Oral)   Wt 157 lb (71.2 kg)   BMI 24.59 kg/m    Physical Examination:  General: Awake, alert, well- nourished, NAD ENMT:  TMs intact, normal light reflex, no erythema, no bulging. Dry crusted nasal drainage. MMM, Oropharynx clear without erythema or tonsillar exudate/hypertrophy Cardio: RRR, no m/r/g noted. No thrill Pulm: No increased WOB.  CTAB, without wheezes, rhonchi or crackles noted.  Skin: Left axilla: No hair under the axilla, patient denies any history of shaving and states he just never gross hair in the axilla. 7cm x 2.5cm non-mobile erythematous/indurated area with  areas of fluctuance.   Incision and Drainage Procedure Note:  The affected area was cleaned and draped in a sterile fashion. Anesthesia was achieved using 5 mL of 2%Lidocaine with epinephrine injected around the wound area using a 25-guage 1.5 inch needle. An 11-blade scalpel was used to incise the wound. A moderate amount of purulent drainage was expressed. A hemostat was used to break any loculations that were present. Iodoform guaze was used to pack the wound. A sterile dressing was applied to the area. The patient tolerated the procedure well. No complications were encountered.    Assessment/Plan: HTN (hypertension) Pt's BP elevated, he does not wish to discuss this and is not on any medications (he does not like them)  Abscess Sizable abscess noted on exam status post incision and drainage with packing. He tolerated the procedure well. No systemic symptoms (the history of chills, but no fevers which is suspect is from a recent URI). Given the size, will start doxycycline as well. Patient advised to follow-up in 1-2 days to remove packing.  Return precautions discussed   No orders of the defined types were placed in this encounter.   Meds ordered this encounter  Medications  . doxycycline (VIBRA-TABS) 100 MG tablet    Sig: Take 1 tablet (100 mg total) by mouth 2 (two) times daily.    Dispense:  10 tablet    Refill:  0    Joanna Puffrystal S. Dorsey PGY-3, [facility]

## 2017-01-15 NOTE — Assessment & Plan Note (Signed)
Sizable abscess noted on exam status post incision and drainage with packing. He tolerated the procedure well. No systemic symptoms (the history of chills, but no fevers which is suspect is from a recent URI). Given the size, will start doxycycline as well. Patient advised to follow-up in 1-2 days to remove packing.  Return precautions discussed

## 2017-01-15 NOTE — Patient Instructions (Signed)
Today we incised and drained the abscess.  Keep the packing in and return tomorrow.  I've sent in an antibiotic, take it twice daily.   Incision and Drainage, Care After Refer to this sheet in the next few weeks. These instructions provide you with information about caring for yourself after your procedure. Your health care provider may also give you more specific instructions. Your treatment has been planned according to current medical practices, but problems sometimes occur. Call your health care provider if you have any problems or questions after your procedure. What can I expect after the procedure? After the procedure, it is common to have:  Pain or discomfort around your incision site.  Drainage from your incision. Follow these instructions at home:  Take over-the-counter and prescription medicines only as told by your health care provider.  If you were prescribed an antibiotic medicine, take it as told by your health care provider.Do not stop taking the antibiotic even if you start to feel better.  Followinstructions from your health care provider about:  How to take care of your incision.  When and how you should change your packing and bandage (dressing). Wash your hands with soap and water before you change your dressing. If soap and water are not available, use hand sanitizer.  When you should remove your dressing.  Do not take baths, swim, or use a hot tub until your health care provider approves.  Keep all follow-up visits as told by your health care provider. This is important.  Check your incision area every day for signs of infection. Check for:  More redness, swelling, or pain.  More fluid or blood.  Warmth.  Pus or a bad smell. Contact a health care provider if:  Your cyst or abscess returns.  You have a fever.  You have more redness, swelling, or pain around your incision.  You have more fluid or blood coming from your incision.  Your  incision feels warm to the touch.  You have pus or a bad smell coming from your incision. Get help right away if:  You have severe pain or bleeding.  You cannot eat or drink without vomiting.  You have decreased urine output.  You become short of breath.  You have chest pain.  You cough up blood.  The area where the incision and drainage occurred becomes numb or it tingles. This information is not intended to replace advice given to you by your health care provider. Make sure you discuss any questions you have with your health care provider. Document Released: 03/02/2012 Document Revised: 05/10/2016 Document Reviewed: 09/29/2015 Elsevier Interactive Patient Education  2017 [employer]

## 2017-01-16 ENCOUNTER — Ambulatory Visit (INDEPENDENT_AMBULATORY_CARE_PROVIDER_SITE_OTHER): Payer: Managed Care, Other (non HMO) | Admitting: Internal Medicine

## 2017-01-16 ENCOUNTER — Encounter: Payer: Self-pay | Admitting: Internal Medicine

## 2017-01-16 VITALS — BP 146/100 | HR 84 | Temp 98.3°F | Ht 67.0 in | Wt 154.2 lb

## 2017-01-16 DIAGNOSIS — L0291 Cutaneous abscess, unspecified: Secondary | ICD-10-CM

## 2017-01-16 NOTE — Progress Notes (Signed)
   [facility] Noralee CharsAsiyah Zairah Arista, MD Phone: [phone]  Reason For Visit: Follow up for drainage of abscess   #Abscess drained on 01/15/2017. Packing placed.  No associated pain.  No nausea/vomitting. No Fever or chills.  Taking doxycycline without any issues   Past Medical History Reviewed problem list.  Medications- reviewed and updated  Objective: BP (!) 146/100   Pulse 84   Temp 98.3 F (36.8 C) (Oral)   Ht 5\' 7"  (1.702 m)   Wt 154 lb 3.2 oz (69.9 kg)   SpO2 96%   BMI 24.15 kg/m  Gen: NAD, alert, cooperative with exam Skin: 2.5 x 5 cm mobile mass, with packing in place, small about of pus noted when pressure applied,  Neuro: Strength and sensation grossly intact   Assessment/Plan: See problem based a/p  Abscess Abscess, still with some slight drainage - No issues with doxycycline BID  - Patient with plan to slow take out the packing or the next 7-10 days  - Follow up in the clinic as needed

## 2017-01-16 NOTE — Assessment & Plan Note (Signed)
Abscess, still with some slight drainage - No issues with doxycycline BID  - Patient with plan to slow take out the packing or the next 7-10 days  - Follow up in the clinic as needed

## 2017-01-16 NOTE — Patient Instructions (Signed)
Follow directions as discussed at visit. Slowly take out about 1 inch of the packing every day or the next 7-10 days. Follow up as needed for worsening of abscess site or if the packing accidental all pulled out.    Contact a health care provider if:  Your cyst or abscess returns.  You have a fever.  You have more redness, swelling, or pain around your incision.  You have more fluid or blood coming from your incision.  Your incision feels warm to the touch.  You have pus or a bad smell coming from your incision. Get help right away if:  You have severe pain or bleeding.  You cannot eat or drink without vomiting.  You have decreased urine output.  You become short of breath.  You have chest pain.  You cough up blood.  The area where the incision and drainage occurred becomes numb or it tingles. This information is not intended to replace advice given to you by your health care provider. Make sure you discuss any questions you have with your health care provider. Document Released: 03/02/2012 Document Revised: 05/10/2016 Document Reviewed: 09/29/2015 Elsevier Interactive Patient Education  2017 [employer]

## 2017-01-21 ENCOUNTER — Ambulatory Visit (INDEPENDENT_AMBULATORY_CARE_PROVIDER_SITE_OTHER): Payer: Managed Care, Other (non HMO) | Admitting: *Deleted

## 2017-01-21 DIAGNOSIS — Z5189 Encounter for other specified aftercare: Secondary | ICD-10-CM

## 2017-01-21 DIAGNOSIS — L0291 Cutaneous abscess, unspecified: Secondary | ICD-10-CM

## 2017-01-21 NOTE — Progress Notes (Signed)
   Patient in nurse for wound check located under left arm pit.  Pt had I&D done on 01/15/17. The area warm touch, non-tender,hard and color is slightly darker than normal skin tone. No drainage or redness noticed. Patient denies any pain. Precept with Dr. Jennette KettleNeal. If patient notice any drainage, area getting bigger, fever to call for an appointment.  Per Dr. Jennette KettleNeal; area possibly developed a keloid.  Patient to return in 3 months if desires treatment per Dr. Jennette KettleNeal with Intralesional steroid injections.  Patient voice understanding on return precautions and if he desires treatment.  Clovis PuMartin, Maleni Seyer L, RN

## 2019-09-20 ENCOUNTER — Ambulatory Visit (HOSPITAL_COMMUNITY)
Admission: EM | Admit: 2019-09-20 | Discharge: 2019-09-20 | Disposition: A | Discharge status: Home / Self Care | Payer: 59 | Attending: Family Medicine | Admitting: Family Medicine

## 2019-09-20 ENCOUNTER — Other Ambulatory Visit: Payer: Self-pay

## 2019-09-20 ENCOUNTER — Encounter (HOSPITAL_COMMUNITY): Payer: Self-pay

## 2019-09-20 DIAGNOSIS — F17210 Nicotine dependence, cigarettes, uncomplicated: Secondary | ICD-10-CM | POA: Diagnosis not present

## 2019-09-20 DIAGNOSIS — I10 Essential (primary) hypertension: Secondary | ICD-10-CM

## 2019-09-20 DIAGNOSIS — M62830 Muscle spasm of back: Secondary | ICD-10-CM | POA: Diagnosis not present

## 2019-09-20 MED ORDER — NAPROXEN 500 MG PO TABS: 500.0000 mg | ORAL_TABLET | Freq: Two times a day (BID) | ORAL | 0 refills | Status: AC

## 2019-09-20 NOTE — ED Triage Notes (Signed)
Patient presents to Urgent Care with complaints of left back pain since this morning. Patient reports when it started it felt like someone was stabbing him, no difficulty breathing, will need a note to be out of work.

## 2019-09-20 NOTE — ED Provider Notes (Signed)
[place]   [id number] 09/20/19 Arrival Time: 0818  ASSESSMENT & PLAN:  1. Muscle spasm of back   2. Tobacco dependence due to cigarettes   3. Uncontrolled hypertension     Able to ambulate here and hemodynamically stable. No indication for imaging of back at this time given no trauma and normal neurological exam. Discussed.  Meds ordered this encounter  Medications   naproxen (NAPROSYN) 500 MG tablet    Sig: Take 1 tablet (500 mg total) by mouth 2 (two) times daily with a meal.    Dispense:  20 tablet    Refill:  0   Work note provided. Encourage ROM/movement as tolerated.  Recommend: Follow-up Information    Martyn Malay, MD.   Specialty: Family Medicine Why: As needed. Contact information: [place] [address] [phone]        [place].   Specialty: Urgent Care Why: If symptoms worsen in any way. Contact information: [place] [phone]         Three plus minutes of smoking cessation counseling done. Patient co-morbidities include HTN and alcohol use. Risks of smoking reviewed including possible lung disease (COPD, cancer) and cardiac disease.  Benefits of smoking cessation also presented. Currently in pre-contemplative stage with no desire to quit at this time. Encourage him to think about this. Also encouraged him to follow-up with a primary care provider should he desire to quit and to follow BP.  Reviewed expectations re: course of current medical issues. Questions answered. Outlined signs and symptoms indicating need for more acute intervention. Patient verbalized understanding. After Visit Summary given.   SUBJECTIVE: History from: patient.  Adam Morton is a 43 y.o. male who presents with complaint of fairly persistent left sided mid to upper back discomfort. Onset abrupt, first noted today. Injury/trama: no. Noticed at work;  'turned to the side and felt a sharp pain'. History of back problems: no. Discomfort described as aching with occasional sharp pain that does not radiate. Pain is worse with prolonged walking/standing, worse with movements involving back, and unchanged with rest. Progressive LE weakness or saddle anesthesia: none. Extremity sensation changes or weakness: none. Ambulatory without difficulty. Normal bowel/bladder habits: yes; without urinary retention. No associated abdominal pain/n/v. Self treatment: has tried nothing for pain relief. No associated CP or SOB.  Reports no chronic steroid use, fevers, IV drug use, or recent back surgeries or procedures.  Increased blood pressure noted today. Reports that he is treated for HTN.  He reports no chest pain on exertion, no dyspnea on exertion, no swelling of ankles, no orthostatic dizziness or lightheadedness, no orthopnea or paroxysmal nocturnal dyspnea, no palpitations and no intermittent claudication symptoms.  ROS: As per HPI. All other systems negative.   OBJECTIVE:  Vitals:   09/20/19 0859  BP: (!) 146/100  Pulse: 72  Resp: 16  Temp: 98.8 F (37.1 C)  TempSrc: Temporal  SpO2: 100%    General appearance: alert; no distress HEENT: [place]; AT Neck: supple with FROM; without midline tenderness CV: RRR Lungs: unlabored respirations; symmetrical air entry Abdomen: soft, non-tender; non-distended Back: poorly localized tenderness to palpation over his mid to upper L back; FROM at waist; bruising: none; without midline tenderness Extremities: no edema; symmetrical with no gross deformities; normal ROM of bilateral LE Skin: warm and dry Neurologic: normal gait; normal reflexes of bilateral LE; normal sensation of bilateral LE; normal strength of bilateral LE  Psychological: alert and cooperative; normal mood and affect   No Known Allergies  PMH: HTN  Social History   Socioeconomic History   Marital status: Single    Spouse name: Not on  file   Number of children: Not on file   Years of education: Not on file   Highest education level: Not on file  Occupational History   Occupation: event planner  Social Needs   Financial resource strain: Not on file   Food insecurity    Worry: Not on file    Inability: Not on file   Transportation needs    Medical: Not on file    Non-medical: Not on file  Tobacco Use   Smoking status: Current Every Day Smoker    Packs/day: 1.00    Years: 20.00    Pack years: 20.00    Types: Cigarettes   Smokeless tobacco: Never Used  Substance and Sexual Activity   Alcohol use: Yes    Alcohol/week: 16.0 standard drinks    Types: 16 Shots of liquor per week    Comment: cocktail nightly, more on weekends   Drug use: Yes    Types: Marijuana    Comment: ectasy (molli) years ago    Sexual activity: Yes    Partners: Female    Comment: no protection   Lifestyle   Physical activity    Days per week: Not on file    Minutes per session: Not on file   Stress: Not on file  Relationships   Social connections    Talks on phone: Not on file    Gets together: Not on file    Attends religious service: Not on file    Active member of club or organization: Not on file    Attends meetings of clubs or organizations: Not on file    Relationship status: Not on file   Intimate partner violence    Fear of current or ex partner: Not on file    Emotionally abused: Not on file    Physically abused: Not on file    Forced sexual activity: Not on file  Other Topics Concern   Not on file  Social History Narrative   Lives in [place].    Hobbies: social interactions.    FH: HTN  Past Surgical History:  Procedure Laterality Date   ORIF TIBIA PLATEAU  06/15/2012   Procedure: OPEN REDUCTION INTERNAL FIXATION (ORIF) TIBIAL PLATEAU;  Surgeon: Budd Palmer, MD;  Location: [facility] OR;  Service: Orthopedics;  Laterality: Right;  ORIF tibial plateau     Mardella Layman, MD 09/20/19 1014

## 2019-09-20 NOTE — Discharge Instructions (Addendum)
Your blood pressure was noted to be elevated during your visit today. You may return here within the next few days to recheck if unable to see your primary care doctor. If your blood pressure remains persistently elevated, you may need to begin taking a medication.  BP (!) 146/100 (BP Location: Left Arm)    Pulse 72    Temp 98.8 F (37.1 C) (Temporal)    Resp 16    SpO2 100%
